# Patient Record
Sex: Female | Born: 1935
Health system: Southern US, Community
[De-identification: ages and names within clinical notes are randomized; demographics above are authoritative.]

## PROBLEM LIST (undated history)

## (undated) DIAGNOSIS — N183 Chronic kidney disease, stage 3 unspecified: Secondary | ICD-10-CM

## (undated) DIAGNOSIS — K649 Unspecified hemorrhoids: Secondary | ICD-10-CM

## (undated) DIAGNOSIS — L659 Nonscarring hair loss, unspecified: Secondary | ICD-10-CM

## (undated) DIAGNOSIS — M1990 Unspecified osteoarthritis, unspecified site: Secondary | ICD-10-CM

## (undated) DIAGNOSIS — F419 Anxiety disorder, unspecified: Secondary | ICD-10-CM

## (undated) DIAGNOSIS — I10 Essential (primary) hypertension: Secondary | ICD-10-CM

## (undated) DIAGNOSIS — M810 Age-related osteoporosis without current pathological fracture: Secondary | ICD-10-CM

## (undated) DIAGNOSIS — F329 Major depressive disorder, single episode, unspecified: Secondary | ICD-10-CM

## (undated) DIAGNOSIS — R51 Headache: Secondary | ICD-10-CM

## (undated) DIAGNOSIS — I129 Hypertensive chronic kidney disease with stage 1 through stage 4 chronic kidney disease, or unspecified chronic kidney disease: Secondary | ICD-10-CM

## (undated) DIAGNOSIS — E782 Mixed hyperlipidemia: Secondary | ICD-10-CM

## (undated) DIAGNOSIS — E039 Hypothyroidism, unspecified: Secondary | ICD-10-CM

## (undated) DIAGNOSIS — G4700 Insomnia, unspecified: Secondary | ICD-10-CM

## (undated) DIAGNOSIS — G8929 Other chronic pain: Secondary | ICD-10-CM

## (undated) DIAGNOSIS — E559 Vitamin D deficiency, unspecified: Secondary | ICD-10-CM

## (undated) DIAGNOSIS — R519 Headache, unspecified: Secondary | ICD-10-CM

## (undated) DIAGNOSIS — F32A Depression, unspecified: Secondary | ICD-10-CM

## (undated) HISTORY — DX: Headache: R51

## (undated) HISTORY — DX: Major depressive disorder, single episode, unspecified: F32.9

## (undated) HISTORY — DX: Hypertensive chronic kidney disease with stage 1 through stage 4 chronic kidney disease, or unspecified chronic kidney disease: I12.9

## (undated) HISTORY — DX: Other chronic pain: G89.29

## (undated) HISTORY — DX: Unspecified osteoarthritis, unspecified site: M19.90

## (undated) HISTORY — DX: Unspecified hemorrhoids: K64.9

## (undated) HISTORY — DX: Vitamin D deficiency, unspecified: E55.9

## (undated) HISTORY — DX: Essential (primary) hypertension: I10

## (undated) HISTORY — DX: Age-related osteoporosis without current pathological fracture: M81.0

## (undated) HISTORY — DX: Hypothyroidism, unspecified: E03.9

## (undated) HISTORY — DX: Insomnia, unspecified: G47.00

## (undated) HISTORY — DX: Mixed hyperlipidemia: E78.2

## (undated) HISTORY — DX: Headache, unspecified: R51.9

## (undated) HISTORY — DX: Chronic kidney disease, stage 3 (moderate): N18.3

## (undated) HISTORY — DX: Anxiety disorder, unspecified: F41.9

## (undated) HISTORY — DX: Chronic kidney disease, stage 3 unspecified: N18.30

## (undated) HISTORY — DX: Depression, unspecified: F32.A

## (undated) HISTORY — DX: Nonscarring hair loss, unspecified: L65.9

---

## 1972-08-09 HISTORY — PX: ABDOMINAL HYSTERECTOMY: SHX81

## 2005-04-27 ENCOUNTER — Ambulatory Visit: Payer: Self-pay | Admitting: Internal Medicine

## 2005-11-18 ENCOUNTER — Ambulatory Visit: Payer: Self-pay | Admitting: Internal Medicine

## 2006-05-04 ENCOUNTER — Ambulatory Visit: Payer: Self-pay | Admitting: Internal Medicine

## 2007-05-10 ENCOUNTER — Ambulatory Visit: Payer: Self-pay | Admitting: Internal Medicine

## 2008-05-22 ENCOUNTER — Ambulatory Visit: Payer: Self-pay | Admitting: Internal Medicine

## 2009-05-28 ENCOUNTER — Ambulatory Visit: Payer: Self-pay | Admitting: Internal Medicine

## 2010-06-10 ENCOUNTER — Ambulatory Visit: Payer: Self-pay | Admitting: Internal Medicine

## 2010-10-07 ENCOUNTER — Ambulatory Visit: Payer: Self-pay | Admitting: Ophthalmology

## 2010-10-19 ENCOUNTER — Ambulatory Visit: Payer: Self-pay | Admitting: Ophthalmology

## 2010-10-19 HISTORY — PX: CATARACT EXTRACTION: SUR2

## 2011-04-02 ENCOUNTER — Other Ambulatory Visit (INDEPENDENT_AMBULATORY_CARE_PROVIDER_SITE_OTHER): Payer: Medicare Other

## 2011-04-02 ENCOUNTER — Encounter: Payer: Self-pay | Admitting: Internal Medicine

## 2011-04-02 ENCOUNTER — Ambulatory Visit (INDEPENDENT_AMBULATORY_CARE_PROVIDER_SITE_OTHER): Payer: Medicare Other | Admitting: Internal Medicine

## 2011-04-02 DIAGNOSIS — K589 Irritable bowel syndrome without diarrhea: Secondary | ICD-10-CM

## 2011-04-02 DIAGNOSIS — R1013 Epigastric pain: Secondary | ICD-10-CM

## 2011-04-02 DIAGNOSIS — Z1211 Encounter for screening for malignant neoplasm of colon: Secondary | ICD-10-CM

## 2011-04-02 DIAGNOSIS — R197 Diarrhea, unspecified: Secondary | ICD-10-CM

## 2011-04-02 LAB — SEDIMENTATION RATE: Sed Rate: 13 mm/hr (ref 0–22)

## 2011-04-02 MED ORDER — ALPRAZOLAM 0.5 MG PO TABS: 0.5000 mg | ORAL_TABLET | Freq: Two times a day (BID) | ORAL | Status: DC | PRN

## 2011-04-02 MED ORDER — PEG-KCL-NACL-NASULF-NA ASC-C 100 G PO SOLR: 1.0000 | Freq: Once | ORAL | Status: DC

## 2011-04-02 NOTE — Patient Instructions (Addendum)
You have been scheduled for an endoscopy and colonoscopy. Please follow the written instructions given to you at your visit today. Please pick up your Moviprep at the pharmacy within the next 2-3 days. You have been scheduled for an abdominal ultrasound at [facility] (1st floor of hospital) on 04/08/11 Thursday at 8:00 am. Please arrive 15 minutes prior to your appointment for registration. Make certain not to have anything to eat or drink 6 hours prior to your appointment. Should you need to reschedule your appointment, please contact radiology at [phone]. Your physician has requested that you go to the basement for the following lab work before leaving today: TSH, TtG, Sedimentation Rate We have given you Dr Dawayne Cirri information. Please contact him to schedule an appointment. We have sent the following medications to your pharmacy for you to pick up at your convenience: Xanax 0.5 mg twice daily CC:Dr Bethann Punches, Dr Caralyn Guile

## 2011-04-02 NOTE — Progress Notes (Signed)
Ana Randall 10-27-1933 MRN [id number]   History of Present Illness:  This is a 77 year old white female who is here to discuss her digestive problems which started after a tragedy occurred in her life. She would not elaborate on the specifics. It happened 1 year ago and she has had abdominal discomfort, urgent diarrhea, dyspepsia and food intolerance since. There is no pre-existing GI history. There is no family history of colon cancer or gallbladder disease. She was told to have an ulcer initially but later on was diagnosed with anxiety and was started on Xanax 0.5 mg at bedtime. It seems to help; she is also on thyroid supplements. She had a colonoscopy in October 2004 which was normal.   Past Medical History  Diagnosis Date  . Senile osteoporosis   . Unspecified hypothyroidism   . Other and unspecified hyperlipidemia   . Essential hypertension, benign   . Anxiety   . Arthritis   . Chronic headaches   . Depression    Past Surgical History  Procedure Date  . Abdominal hysterectomy     reports that she has never smoked. She has never used smokeless tobacco. She reports that she does not drink alcohol or use illicit drugs. family history includes Diabetes in an unspecified family member. Allergies  Allergen Reactions  . Azithromycin   . Sulfa Antibiotics         Review of Systems: Denies dysphagia, odynophagia, chest pain or shortness of breath. Positive for swelling of both feet and for weight gain of 10-15 pounds  The remainder of the 10  point ROS is negative except as outlined in H&P   Physical Exam: General appearance  Well developed, in no distress. Eyes- non icteric. HEENT nontraumatic, normocephalic. Mouth no lesions, tongue papillated, no cheilosis. Neck supple without adenopathy, thyroid not enlarged, no carotid bruits, no JVD. Lungs Clear to auscultation bilaterally. Cor normal S1 normal S2, regular rhythm , no murmur,  quiet precordium. Abdomen soft abdomen  with normoactive bowel sounds minimal epigastric tenderness. Liver edge at costal margin. No CVA tenderness. Lower abdomen normal. Rectal: Stool Hemoccult negative stool. Extremities 1+ pedal edema. Skin no lesions. Neurological alert and oriented x 3. Psychological normal mood and affect.  Assessment and Plan:  Problem #1 Patient has digestive problems characterized by dyspepsia, epigastric pain and urgent diarrhea of one year duration, possibly of functional origin. It started suddenly after a stressful episode. She is due for a colonoscopy for screening. She is also interested in further evaluation to rule out symptomatic gallbladder disease or peptic ulcer disease. We will schedule her for an upper endoscopy and colonoscopy as well as an upper abdominal ultrasound. We will check her TSH level and sedimentation rate as well as tissue transglutaminase. She will increase her Xanax to 0.25 mg in the morning and 0.5 mg at bedtime. I have discussed stress counseling with her and she is agreeable to talking to a psychologist or counselor about dealing with her stress. We gave her a contact number.    04/02/2011 Ana Randall

## 2011-04-05 ENCOUNTER — Telehealth: Payer: Self-pay | Admitting: *Deleted

## 2011-04-05 NOTE — Telephone Encounter (Signed)
error 

## 2011-04-05 NOTE — Telephone Encounter (Signed)
Left a message for patient to call me. 

## 2011-04-05 NOTE — Telephone Encounter (Signed)
Spoke with patient and gave her results. Patient states she is having eye surgery on September 5th and she is also scheduled for a colonoscopy on Sept. 11th. She wants to know if Dr. Juanda Chance thinks this will be okay to have these procedures this close together or if she should postpone the colonoscopy. Please, advise.

## 2011-04-05 NOTE — Telephone Encounter (Signed)
Message copied by Daphine Deutscher on Mon Apr 05, 2011 11:48 AM ------      Message from: Hart Carwin      Created: Sat Apr 03, 2011 11:17 PM       Please cALL pt with all labs normal

## 2011-04-05 NOTE — Telephone Encounter (Signed)
Message copied by Daphine Deutscher on Mon Apr 05, 2011 11:44 AM ------      Message from: Hart Carwin      Created: Sat Apr 03, 2011 11:17 PM       Please cALL pt with all labs normal

## 2011-04-06 ENCOUNTER — Telehealth: Payer: Self-pay | Admitting: *Deleted

## 2011-04-06 NOTE — Telephone Encounter (Signed)
Message copied by Daphine Deutscher on Tue Apr 06, 2011  1:04 PM ------      Message from: Hart Carwin      Created: Tue Apr 06, 2011  9:58 AM       Please call pt with all blood tests negative including sprue test

## 2011-04-06 NOTE — Telephone Encounter (Signed)
Spoke with patient and r/s her ECOL to 05/12/11 11:00Am with 10:00 AM arrival at [facility]. Cancelled her hospital ECOL with Arlys John.

## 2011-04-06 NOTE — Telephone Encounter (Signed)
Patient notified of results as per Dr. Brodie. 

## 2011-04-06 NOTE — Telephone Encounter (Signed)
Message copied by Ana Randall on Tue Apr 06, 2011  1:06 PM ------      Message from: Hart Carwin      Created: Tue Apr 06, 2011  9:58 AM       Please call pt with all blood tests negative including sprue test

## 2011-04-06 NOTE — Telephone Encounter (Signed)
I think she ought  to wait 4 weeks after the eye surgery to have colonoscopyhe

## 2011-04-08 ENCOUNTER — Ambulatory Visit (HOSPITAL_COMMUNITY)
Admission: RE | Admit: 2011-04-08 | Discharge: 2011-04-08 | Disposition: A | Discharge status: Home / Self Care | Payer: Medicare Other | Source: Ambulatory Visit | Attending: Internal Medicine | Admitting: Internal Medicine

## 2011-04-08 DIAGNOSIS — R1013 Epigastric pain: Secondary | ICD-10-CM | POA: Insufficient documentation

## 2011-04-08 DIAGNOSIS — I10 Essential (primary) hypertension: Secondary | ICD-10-CM | POA: Insufficient documentation

## 2011-04-08 DIAGNOSIS — K8020 Calculus of gallbladder without cholecystitis without obstruction: Secondary | ICD-10-CM | POA: Insufficient documentation

## 2011-04-08 DIAGNOSIS — K7689 Other specified diseases of liver: Secondary | ICD-10-CM | POA: Insufficient documentation

## 2011-04-13 ENCOUNTER — Telehealth: Payer: Self-pay | Admitting: *Deleted

## 2011-04-13 NOTE — Telephone Encounter (Signed)
Message copied by Daphine Deutscher on Tue Apr 13, 2011  9:12 AM ------      Message from: Hart Carwin      Created: Sun Apr 11, 2011  6:39 PM       Please call pt with a single gall bladder stone found on the ultrasound. No signs of acute or chronic inflammation. Please keep Your appointment for EGD/colon.

## 2011-04-13 NOTE — Telephone Encounter (Signed)
Patient given results as per Dr. Brodie 

## 2011-04-13 NOTE — Telephone Encounter (Signed)
Left a message for patient to call me. 

## 2011-04-14 ENCOUNTER — Ambulatory Visit: Payer: Self-pay | Admitting: Ophthalmology

## 2011-04-14 HISTORY — PX: CATARACT EXTRACTION: SUR2

## 2011-04-20 ENCOUNTER — Encounter: Payer: Medicare Other | Admitting: Internal Medicine

## 2011-04-23 ENCOUNTER — Ambulatory Visit (INDEPENDENT_AMBULATORY_CARE_PROVIDER_SITE_OTHER): Payer: No Typology Code available for payment source | Admitting: Licensed Clinical Social Worker

## 2011-04-23 DIAGNOSIS — F331 Major depressive disorder, recurrent, moderate: Secondary | ICD-10-CM

## 2011-04-23 DIAGNOSIS — F411 Generalized anxiety disorder: Secondary | ICD-10-CM

## 2011-04-30 ENCOUNTER — Ambulatory Visit: Payer: Medicare Other | Admitting: Psychology

## 2011-05-03 ENCOUNTER — Ambulatory Visit (INDEPENDENT_AMBULATORY_CARE_PROVIDER_SITE_OTHER): Payer: No Typology Code available for payment source | Admitting: Licensed Clinical Social Worker

## 2011-05-03 DIAGNOSIS — F331 Major depressive disorder, recurrent, moderate: Secondary | ICD-10-CM

## 2011-05-03 DIAGNOSIS — F411 Generalized anxiety disorder: Secondary | ICD-10-CM

## 2011-05-05 ENCOUNTER — Ambulatory Visit: Payer: Self-pay | Admitting: Internal Medicine

## 2011-05-06 ENCOUNTER — Telehealth: Payer: Self-pay | Admitting: *Deleted

## 2011-05-06 NOTE — Telephone Encounter (Signed)
Spoke to patient and went over cancellation agreement, advanced directives and financial responsibility form for [facility] since she was originally scheduled at the hospital and did not receive the [facility] paperwork at that time. Patient verbalizes understanding and states that she will bring back signed copies of the forms with her to her appointment (info has been mailed to her).

## 2011-05-10 ENCOUNTER — Ambulatory Visit: Payer: Medicare Other | Admitting: Psychology

## 2011-05-12 ENCOUNTER — Encounter: Payer: Self-pay | Admitting: Internal Medicine

## 2011-05-12 ENCOUNTER — Ambulatory Visit (AMBULATORY_SURGERY_CENTER): Payer: Medicare Other | Admitting: Internal Medicine

## 2011-05-12 DIAGNOSIS — R109 Unspecified abdominal pain: Secondary | ICD-10-CM

## 2011-05-12 DIAGNOSIS — Z1211 Encounter for screening for malignant neoplasm of colon: Secondary | ICD-10-CM

## 2011-05-12 DIAGNOSIS — R1013 Epigastric pain: Secondary | ICD-10-CM

## 2011-05-12 MED ORDER — DICYCLOMINE HCL 10 MG PO CAPS: 10.0000 mg | ORAL_CAPSULE | Freq: Two times a day (BID) | ORAL | Status: DC | PRN

## 2011-05-12 MED ORDER — SODIUM CHLORIDE 0.9 % IV SOLN: 500.0000 mL | INTRAVENOUS | Status: DC

## 2011-05-12 NOTE — Patient Instructions (Signed)
Green and blue discharge instructions reviewed with patient and care partner.  Impressions/recommendations:  Normal EGD  Normal colonoscopy  Await biopsy results for causes of diarrhea.  Continue medications as you were taking them prior to your exam.

## 2011-05-13 ENCOUNTER — Telehealth: Payer: Self-pay | Admitting: *Deleted

## 2011-05-13 NOTE — Telephone Encounter (Signed)

## 2011-05-17 ENCOUNTER — Encounter: Payer: Self-pay | Admitting: Internal Medicine

## 2011-05-17 ENCOUNTER — Ambulatory Visit: Payer: No Typology Code available for payment source | Admitting: Licensed Clinical Social Worker

## 2011-06-30 ENCOUNTER — Ambulatory Visit: Payer: Self-pay | Admitting: Internal Medicine

## 2011-09-20 ENCOUNTER — Emergency Department (HOSPITAL_COMMUNITY)
Admission: EM | Admit: 2011-09-20 | Discharge: 2011-09-20 | Disposition: A | Discharge status: Home / Self Care | Payer: Medicare Other | Attending: Emergency Medicine | Admitting: Emergency Medicine

## 2011-09-20 ENCOUNTER — Encounter (HOSPITAL_COMMUNITY): Payer: Self-pay | Admitting: Emergency Medicine

## 2011-09-20 DIAGNOSIS — E039 Hypothyroidism, unspecified: Secondary | ICD-10-CM | POA: Insufficient documentation

## 2011-09-20 DIAGNOSIS — F411 Generalized anxiety disorder: Secondary | ICD-10-CM | POA: Insufficient documentation

## 2011-09-20 DIAGNOSIS — R4589 Other symptoms and signs involving emotional state: Secondary | ICD-10-CM

## 2011-09-20 DIAGNOSIS — F419 Anxiety disorder, unspecified: Secondary | ICD-10-CM

## 2011-09-20 DIAGNOSIS — F4389 Other reactions to severe stress: Secondary | ICD-10-CM | POA: Insufficient documentation

## 2011-09-20 DIAGNOSIS — I10 Essential (primary) hypertension: Secondary | ICD-10-CM | POA: Insufficient documentation

## 2011-09-20 DIAGNOSIS — F438 Other reactions to severe stress: Secondary | ICD-10-CM | POA: Insufficient documentation

## 2011-09-20 DIAGNOSIS — Z79899 Other long term (current) drug therapy: Secondary | ICD-10-CM | POA: Insufficient documentation

## 2011-09-20 DIAGNOSIS — M129 Arthropathy, unspecified: Secondary | ICD-10-CM | POA: Insufficient documentation

## 2011-09-20 DIAGNOSIS — M818 Other osteoporosis without current pathological fracture: Secondary | ICD-10-CM | POA: Insufficient documentation

## 2011-09-20 NOTE — ED Provider Notes (Signed)
History     CSN: [id number]  Arrival date & time 09/20/11  1002   First MD Initiated Contact with Patient 09/20/11 1112      Chief Complaint  Patient presents with  . Panic Attack    (Consider location/radiation/quality/duration/timing/severity/associated sxs/prior treatment) The history is provided by the patient.   the patient is a 78 year old, female, who presents to the emergency department complaining of anxiety and stress.  She states that her husband and she went home to another person, who lives next door to them.  The attendant calls the house, frequently, and interrupts her time with her husband.  This occurs while their home, as well as when they are out to dinner.  Is causing stress between her and her husband.  She denies suicidal or homicidal ideations.  She says the last incident was 2 nights ago, when she was at dinner, at a restaurant with her husband.  She began thinking about it again.  Today, and it caused her to have an anxiety attack.  She is asymptomatic now.  She has no other complaints.  Past Medical History  Diagnosis Date  . Senile osteoporosis   . Unspecified hypothyroidism   . Essential hypertension, benign   . Anxiety   . Arthritis   . Chronic headaches   . Depression     Past Surgical History  Procedure Date  . Abdominal hysterectomy   . Cataract extraction     Family History  Problem Relation Age of Onset  . Diabetes      History  Substance Use Topics  . Smoking status: Never Smoker   . Smokeless tobacco: Never Used  . Alcohol Use: No    OB History    Grav Para Term Preterm Abortions TAB SAB Ect Mult Living                  Review of Systems  Psychiatric/Behavioral:       Anxiety    Allergies  Azithromycin and Sulfa antibiotics  Home Medications   Current Outpatient Rx  Name Route Sig Dispense Refill  . ACETAMINOPHEN 500 MG PO TABS Oral Take 1,000 mg by mouth every 6 (six) hours as needed. For pain    . ALPRAZOLAM 0.5  MG PO TABS Oral Take 0.5 mg by mouth 2 (two) times daily as needed. For anxiety    . SUPER B COMPLEX PO TABS Oral Take by mouth 1 day or 1 dose.      Marland Kitchen CALCIUM PO Oral Take 1 capsule by mouth 2 (two) times daily.     . DHA-EPA-COENZYME Q10-VITAMIN E 120-180-50-30 PO CAPS Oral Take 1 capsule by mouth daily.     Marland Kitchen DICYCLOMINE HCL 10 MG PO CAPS Oral Take 10 mg by mouth 2 (two) times daily as needed. For IBS    . OMEGA-3 FATTY ACIDS 1000 MG PO CAPS Oral Take 1 g by mouth daily.     Marland Kitchen LEVOTHYROXINE SODIUM 25 MCG PO TABS Oral Take 25 mcg by mouth daily.      Marland Kitchen LISINOPRIL-HYDROCHLOROTHIAZIDE 20-12.5 MG PO TABS Oral Take 1 tablet by mouth daily.      Marygrace Drought WOMENS PO Oral Take by mouth. Once a day       BP 126/54  Pulse 80  Temp(Src) 98.3 F (36.8 C) (Oral)  Resp 16  SpO2 96%  Physical Exam  Constitutional: She is oriented to person, place, and time. She appears well-developed and well-nourished.  HENT:  Head: Normocephalic and  atraumatic.  Eyes: Conjunctivae are normal.  Neck: Normal range of motion.  Pulmonary/Chest: Effort normal.  Musculoskeletal: Normal range of motion.  Neurological: She is alert and oriented to person, place, and time.  Skin: Skin is warm and dry.  Psychiatric: She has a normal mood and affect. Her behavior is normal. Judgment and thought content normal.    ED Course  Procedures (including critical care time)  Labs Reviewed - No data to display No results found.   1. Anxiety   2. Difficulty coping       MDM  He is ED with coping, difficulty.  No suicidal or homicidal ideations.  No delusions.  No psychosis.  There is no indication for testing or intervention in the emergency department today.        Nicholes Stairs, MD 09/20/11 1128

## 2011-09-20 NOTE — ED Notes (Signed)
Panic attack x 2-3 months   Coming from one of her renters she states

## 2011-09-20 NOTE — ED Notes (Signed)
Pt's husband is in the waiting room.  She does not want him in the room during her assessment, but she would like the doctor to speak with her husband separately.

## 2011-09-20 NOTE — ED Notes (Signed)
Called [place], [facility] and made aware of pt's suicide assessment.  Pt is being placed in paper scrubs and well transfer to [place].

## 2011-09-20 NOTE — ED Notes (Signed)
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  Pt states that when she has to be around the tenant or even speak to the tenant on the phone she begins to shake and is unable to stop the shaking.  Pt also reports that she becomes SOB during these times.

## 2011-10-28 ENCOUNTER — Ambulatory Visit: Payer: Self-pay | Admitting: Internal Medicine

## 2012-05-26 ENCOUNTER — Emergency Department (HOSPITAL_COMMUNITY)
Admission: EM | Admit: 2012-05-26 | Discharge: 2012-05-26 | Disposition: A | Discharge status: Home / Self Care | Payer: Medicare Other | Attending: Emergency Medicine | Admitting: Emergency Medicine

## 2012-05-26 ENCOUNTER — Encounter (HOSPITAL_COMMUNITY): Payer: Self-pay | Admitting: *Deleted

## 2012-05-26 ENCOUNTER — Emergency Department (HOSPITAL_COMMUNITY): Payer: Medicare Other

## 2012-05-26 DIAGNOSIS — E039 Hypothyroidism, unspecified: Secondary | ICD-10-CM | POA: Insufficient documentation

## 2012-05-26 DIAGNOSIS — Z79899 Other long term (current) drug therapy: Secondary | ICD-10-CM | POA: Insufficient documentation

## 2012-05-26 DIAGNOSIS — R51 Headache: Secondary | ICD-10-CM | POA: Insufficient documentation

## 2012-05-26 DIAGNOSIS — I10 Essential (primary) hypertension: Secondary | ICD-10-CM | POA: Insufficient documentation

## 2012-05-26 LAB — BASIC METABOLIC PANEL
CO2: 25 mEq/L (ref 19–32)
Calcium: 10.6 mg/dL — ABNORMAL HIGH (ref 8.4–10.5)
Chloride: 102 mEq/L (ref 96–112)
Creatinine, Ser: 1.06 mg/dL (ref 0.50–1.10)
Glucose, Bld: 112 mg/dL — ABNORMAL HIGH (ref 70–99)

## 2012-05-26 LAB — CBC WITH DIFFERENTIAL/PLATELET
Eosinophils Relative: 6 % — ABNORMAL HIGH (ref 0–5)
HCT: 37 % (ref 36.0–46.0)
Hemoglobin: 12.6 g/dL (ref 12.0–15.0)
Lymphocytes Relative: 22 % (ref 12–46)
Lymphs Abs: 1.8 10*3/uL (ref 0.7–4.0)
MCV: 89.6 fL (ref 78.0–100.0)
Monocytes Absolute: 0.5 10*3/uL (ref 0.1–1.0)
Neutro Abs: 5.4 10*3/uL (ref 1.7–7.7)
RBC: 4.13 MIL/uL (ref 3.87–5.11)
WBC: 8.3 10*3/uL (ref 4.0–10.5)

## 2012-05-26 MED ORDER — SODIUM CHLORIDE 0.9 % IV BOLUS (SEPSIS)
1000.0000 mL | Freq: Once | INTRAVENOUS | Status: AC
  Administered 2012-05-26: 1000 mL via INTRAVENOUS

## 2012-05-26 MED ORDER — METOCLOPRAMIDE HCL 5 MG/ML IJ SOLN
10.0000 mg | Freq: Once | INTRAMUSCULAR | Status: AC
  Administered 2012-05-26: 10 mg via INTRAVENOUS
  Filled 2012-05-26: qty 2

## 2012-05-26 MED ORDER — IBUPROFEN 600 MG PO TABS: 600.0000 mg | ORAL_TABLET | Freq: Four times a day (QID) | ORAL | Status: DC | PRN

## 2012-05-26 MED ORDER — KETOROLAC TROMETHAMINE 30 MG/ML IJ SOLN
30.0000 mg | Freq: Once | INTRAMUSCULAR | Status: AC
  Administered 2012-05-26: 30 mg via INTRAVENOUS
  Filled 2012-05-26: qty 1

## 2012-05-26 NOTE — ED Notes (Signed)
Rt. Side, occipital region of head hurts; dr. Charline Bills, "related to high bp." Then, Wed. Radiated to lt. Side of head, occipital region, now sharp pains in head, and nose feels stopped up; cough and swallowing makes pain worse

## 2012-05-26 NOTE — ED Provider Notes (Addendum)
History     CSN: [id number]  Arrival date & time 05/26/12  1478   First MD Initiated Contact with Patient 05/26/12 [phone]      Chief Complaint  Patient presents with  . Headache    (Consider location/radiation/quality/duration/timing/severity/associated sxs/prior treatment) HPI Comments: 78 y/o woman comes in with cc of headaches. Pt has hx of diabetes. Pt started having headaches on Tuesday. The headache was on the right side first, then on left side, and now on the back. The pain is described as pressure like pain. The pain only lasts for seconds, and is usually precipitated by cough, moment. There is no nausea, vomiting, visual complains, seizures, altered mental status, loss of consciousness, new weakness, or numbness, no gait instability. Pt has no neck stiffness, pain. No hx of similar complain before.   Patient is a 78 y.o. female presenting with headaches. The history is provided by the patient.  Headache  Pertinent negatives include no shortness of breath, no nausea and no vomiting.    Past Medical History  Diagnosis Date  . Senile osteoporosis   . Unspecified hypothyroidism   . Essential hypertension, benign   . Anxiety   . Arthritis   . Chronic headaches   . Depression     Past Surgical History  Procedure Date  . Abdominal hysterectomy   . Cataract extraction     Family History  Problem Relation Age of Onset  . Diabetes      History  Substance Use Topics  . Smoking status: Never Smoker   . Smokeless tobacco: Never Used  . Alcohol Use: No    OB History    Grav Para Term Preterm Abortions TAB SAB Ect Mult Living                  Review of Systems  Constitutional: Negative for activity change.  HENT: Negative for facial swelling and neck pain.   Respiratory: Negative for cough, shortness of breath and wheezing.   Cardiovascular: Negative for chest pain.  Gastrointestinal: Negative for nausea, vomiting, abdominal pain, diarrhea, constipation, blood  in stool and abdominal distention.  Genitourinary: Negative for hematuria and difficulty urinating.  Skin: Negative for color change.  Neurological: Positive for headaches. Negative for speech difficulty.  Hematological: Does not bruise/bleed easily.  Psychiatric/Behavioral: Negative for confusion.    Allergies  Azithromycin  Home Medications   Current Outpatient Rx  Name Route Sig Dispense Refill  . ALPRAZOLAM 0.5 MG PO TABS Oral Take 0.5 mg by mouth at bedtime.     Marland Kitchen LEVOTHYROXINE SODIUM 25 MCG PO TABS Oral Take 25 mcg by mouth daily.      . ADULT MULTIVITAMIN W/MINERALS CH Oral Take 1 tablet by mouth daily.    Marland Kitchen OLMESARTAN MEDOXOMIL-HCTZ 40-12.5 MG PO TABS Oral Take 1 tablet by mouth daily.    Marland Kitchen OVER THE COUNTER MEDICATION Oral Take 1 tablet by mouth daily. Calcium 600 + D    . VITAMIN E 400 UNITS PO CAPS Oral Take 400 Units by mouth daily.      BP 167/74  Pulse 79  Temp 98.5 F (36.9 C) (Oral)  Resp 18  SpO2 94%  Physical Exam  Constitutional: She is oriented to person, place, and time. She appears well-developed.  HENT:  Head: Normocephalic and atraumatic.       No bruits on neck exam  Eyes: Conjunctivae normal and EOM are normal. Pupils are equal, round, and reactive to light.  Neck: Normal range of motion. Neck  supple.  Cardiovascular: Normal rate, regular rhythm and normal heart sounds.   Pulmonary/Chest: Effort normal and breath sounds normal. No respiratory distress.  Abdominal: Soft. Bowel sounds are normal. She exhibits no distension. There is no tenderness. There is no rebound and no guarding.  Neurological: She is alert and oriented to person, place, and time. She displays normal reflexes. No cranial nerve deficit. Coordination normal.       Cerebellar exam, motor and sensory exam are all WNL  Skin: Skin is warm and dry.    ED Course  Procedures (including critical care time)  Labs Reviewed  CBC WITH DIFFERENTIAL - Abnormal; Notable for the following:      Eosinophils Relative 6 (*)     All other components within normal limits  BASIC METABOLIC PANEL   No results found.   No diagnosis found.    MDM  DDX includes: Primary headaches - including migrainous headaches, cluster headaches, tension headaches. ICH Carotid dissection Cavernous sinus thrombosis Meningitis Encephalitis Sinusitis Tumor Vascular headaches AV malformation Brain aneurysm Muscular headaches  A/P: Pt comes in with cc of headaches. Pt's headache is intermittent, not a thunder clap headache, and it started on Tuesday, with no neuro complains, no meningeal signs. Concerns for life threatning secondary headache is low. We will get CT head, and basic labs with hydration and pain meds, and then reassess.    Derwood Kaplan, MD 05/26/12 1037  Headache completely resolved, not even reproduced with movement like before. Will d.c.  Derwood Kaplan, MD 05/26/12 1310

## 2012-07-26 ENCOUNTER — Ambulatory Visit: Payer: Self-pay | Admitting: Internal Medicine

## 2013-04-22 ENCOUNTER — Emergency Department (HOSPITAL_COMMUNITY): Payer: Medicare Other

## 2013-04-22 ENCOUNTER — Emergency Department (HOSPITAL_COMMUNITY)
Admission: EM | Admit: 2013-04-22 | Discharge: 2013-04-22 | Disposition: A | Discharge status: Home / Self Care | Payer: Medicare Other | Attending: Emergency Medicine | Admitting: Emergency Medicine

## 2013-04-22 ENCOUNTER — Encounter (HOSPITAL_COMMUNITY): Payer: Self-pay | Admitting: Nurse Practitioner

## 2013-04-22 DIAGNOSIS — R002 Palpitations: Secondary | ICD-10-CM | POA: Insufficient documentation

## 2013-04-22 DIAGNOSIS — M810 Age-related osteoporosis without current pathological fracture: Secondary | ICD-10-CM | POA: Insufficient documentation

## 2013-04-22 DIAGNOSIS — F411 Generalized anxiety disorder: Secondary | ICD-10-CM | POA: Insufficient documentation

## 2013-04-22 DIAGNOSIS — E039 Hypothyroidism, unspecified: Secondary | ICD-10-CM | POA: Insufficient documentation

## 2013-04-22 DIAGNOSIS — R42 Dizziness and giddiness: Secondary | ICD-10-CM | POA: Insufficient documentation

## 2013-04-22 DIAGNOSIS — Z79899 Other long term (current) drug therapy: Secondary | ICD-10-CM | POA: Insufficient documentation

## 2013-04-22 DIAGNOSIS — I10 Essential (primary) hypertension: Secondary | ICD-10-CM | POA: Insufficient documentation

## 2013-04-22 DIAGNOSIS — M129 Arthropathy, unspecified: Secondary | ICD-10-CM | POA: Insufficient documentation

## 2013-04-22 DIAGNOSIS — F329 Major depressive disorder, single episode, unspecified: Secondary | ICD-10-CM | POA: Insufficient documentation

## 2013-04-22 DIAGNOSIS — F3289 Other specified depressive episodes: Secondary | ICD-10-CM | POA: Insufficient documentation

## 2013-04-22 LAB — BASIC METABOLIC PANEL
BUN: 16 mg/dL (ref 6–23)
CO2: 26 mEq/L (ref 19–32)
Calcium: 10.7 mg/dL — ABNORMAL HIGH (ref 8.4–10.5)
Chloride: 98 mEq/L (ref 96–112)
Creatinine, Ser: 1.02 mg/dL (ref 0.50–1.10)

## 2013-04-22 LAB — CBC
HCT: 38.7 % (ref 36.0–46.0)
MCH: 30.2 pg (ref 26.0–34.0)
MCV: 87.4 fL (ref 78.0–100.0)
Platelets: 284 10*3/uL (ref 150–400)
RBC: 4.43 MIL/uL (ref 3.87–5.11)
RDW: 13.7 % (ref 11.5–15.5)
WBC: 8 10*3/uL (ref 4.0–10.5)

## 2013-04-22 LAB — POCT I-STAT TROPONIN I: Troponin i, poc: 0 ng/mL (ref 0.00–0.08)

## 2013-04-22 NOTE — ED Provider Notes (Signed)
CSN: [id number]     Arrival date & time 04/22/13  1050 History   First MD Initiated Contact with Patient 04/22/13 1105     Chief Complaint  Patient presents with  . Tachycardia   (Consider location/radiation/quality/duration/timing/severity/associated sxs/prior Treatment) HPI Patient presents with concern of palpitations. She has had similar episodes in the past, and several episodes of the past week. However, today, soon after awakening she felt suddenly lightheaded, felt her pulse to be greater than 100. There is no associated chest pain, belly pain, nausea, vomiting, syncope. Symptoms persisted, and she presents here for evaluation.  On my exam she states that she feels moderately better. No clear precipitant, and no clear alleviating or exacerbating factors. She states that she was in her usual state of health prior to the onset of symptoms.  Past Medical History  Diagnosis Date  . Senile osteoporosis   . Unspecified hypothyroidism   . Essential hypertension, benign   . Anxiety   . Arthritis   . Chronic headaches   . Depression    Past Surgical History  Procedure Laterality Date  . Abdominal hysterectomy    . Cataract extraction     Family History  Problem Relation Age of Onset  . Diabetes     History  Substance Use Topics  . Smoking status: Never Smoker   . Smokeless tobacco: Never Used  . Alcohol Use: No   OB History   Grav Para Term Preterm Abortions TAB SAB Ect Mult Living                 Review of Systems  Constitutional:       Per HPI, otherwise negative  HENT:       Per HPI, otherwise negative  Respiratory:       Per HPI, otherwise negative  Cardiovascular:       Per HPI, otherwise negative  Gastrointestinal: Negative for vomiting.  Endocrine:       Negative aside from HPI  Genitourinary:       Neg aside from HPI   Musculoskeletal:       Per HPI, otherwise negative  Skin: Negative.   Neurological: Positive for light-headedness. Negative for  syncope.    Allergies  Azithromycin  Home Medications   Current Outpatient Rx  Name  Route  Sig  Dispense  Refill  . ALPRAZolam (XANAX) 0.5 MG tablet   Oral   Take 0.5 mg by mouth at bedtime.          Marland Kitchen ibuprofen (ADVIL,MOTRIN) 600 MG tablet   Oral   Take 1 tablet (600 mg total) by mouth every 6 (six) hours as needed for pain.   30 tablet   0   . levothyroxine (SYNTHROID, LEVOTHROID) 25 MCG tablet   Oral   Take 25 mcg by mouth daily.           . Multiple Vitamin (MULTIVITAMIN WITH MINERALS) TABS   Oral   Take 1 tablet by mouth daily.         Marland Kitchen olmesartan-hydrochlorothiazide (BENICAR HCT) 40-12.5 MG per tablet   Oral   Take 1 tablet by mouth daily.         Marland Kitchen OVER THE COUNTER MEDICATION   Oral   Take 1 tablet by mouth daily. Calcium 600 + D         . vitamin E 400 UNIT capsule   Oral   Take 400 Units by mouth daily.  BP 130/58  Pulse 92  Temp(Src) 98.2 F (36.8 C) (Oral)  Resp 17  Ht 5' (1.524 m)  Wt 128 lb (58.06 kg)  BMI 25 kg/m2  SpO2 95% Physical Exam  Nursing note and vitals reviewed. Constitutional: She is oriented to person, place, and time. She appears well-developed and well-nourished. No distress.  HENT:  Head: Normocephalic and atraumatic.  Eyes: Conjunctivae and EOM are normal.  Cardiovascular: Regular rhythm.  Tachycardia present.   Pulmonary/Chest: Effort normal and breath sounds normal. No stridor. No respiratory distress.  Abdominal: She exhibits no distension.  Musculoskeletal: She exhibits no edema.  Neurological: She is alert and oriented to person, place, and time. No cranial nerve deficit.  Skin: Skin is warm and dry.  Psychiatric: She has a normal mood and affect.    ED Course  Procedures (including critical care time) Labs Review Labs Reviewed  BASIC METABOLIC PANEL - Abnormal; Notable for the following:    Glucose, Bld 108 (*)    Calcium 10.7 (*)    GFR calc non Af Amer 52 (*)    GFR calc Af Amer 60 (*)     All other components within normal limits  CBC  POCT I-STAT TROPONIN I   Imaging Review Dg Chest 2 View  04/22/2013   *RADIOLOGY REPORT*  Clinical Data: Tachycardia  CHEST - 2 VIEW  Comparison: None.  Findings: The heart size and vascular pattern are normal.  The lungs are clear.  IMPRESSION: Negative   Original Report Authenticated By: Esperanza Heir, M.D.   Pulse oximetry 99% room air normal Cardiac monitor is regular, 110, sinus, abnormal EKG has sinus tachycardia with a rate of 110, rightward axis, no notable changes from prior.  1:57 PM HR in 80's, no symptoms. I discussed all findings with the patient and her husband.  Absent any ongoing symptoms, with reassuring labs, she'll follow up with her physician tomorrow. MDM  No diagnosis found. This patient with a history of anxiety, palpitations now presents with palpitations and on initial exam does have tachycardia.  However, improved substantially here, with minimal intervention beyond fluids.  With the resolution of symptoms, the absence of distress, and with a previously diagnosed anxiety and palpitations, patient is appropriate for discharge with outpatient management.  Absent pain, ischemia on EKG, lightheadedness, syncope, persistent symptoms, there is no indication for admission, though additional evaluation and management is required.    Gerhard Munch, MD 04/22/13 1359

## 2013-04-22 NOTE — ED Notes (Signed)
States she was getting ready for church and suddenly felt sweaty, lightheaded, and shaky. She checked her heart rate and it was 120. Pt reports history of anxiety and this "feels similar." denies any pain

## 2013-04-22 NOTE — ED Notes (Signed)
MD at bedside. 

## 2013-07-30 ENCOUNTER — Ambulatory Visit: Payer: Self-pay | Admitting: Internal Medicine

## 2014-08-06 ENCOUNTER — Ambulatory Visit: Payer: Self-pay | Admitting: Family Medicine

## 2014-11-25 DIAGNOSIS — Z87898 Personal history of other specified conditions: Secondary | ICD-10-CM | POA: Insufficient documentation

## 2014-12-18 DIAGNOSIS — G4700 Insomnia, unspecified: Secondary | ICD-10-CM | POA: Diagnosis not present

## 2014-12-18 DIAGNOSIS — Z634 Disappearance and death of family member: Secondary | ICD-10-CM | POA: Diagnosis not present

## 2014-12-18 DIAGNOSIS — E039 Hypothyroidism, unspecified: Secondary | ICD-10-CM | POA: Diagnosis not present

## 2014-12-18 DIAGNOSIS — E782 Mixed hyperlipidemia: Secondary | ICD-10-CM | POA: Diagnosis not present

## 2014-12-18 DIAGNOSIS — I10 Essential (primary) hypertension: Secondary | ICD-10-CM | POA: Diagnosis not present

## 2015-03-03 DIAGNOSIS — H524 Presbyopia: Secondary | ICD-10-CM | POA: Diagnosis not present

## 2015-03-11 ENCOUNTER — Ambulatory Visit: Payer: Self-pay | Admitting: Family Medicine

## 2015-03-13 ENCOUNTER — Encounter: Payer: Self-pay | Admitting: Family Medicine

## 2015-03-13 ENCOUNTER — Ambulatory Visit (INDEPENDENT_AMBULATORY_CARE_PROVIDER_SITE_OTHER): Payer: Medicare Other | Admitting: Family Medicine

## 2015-03-13 VITALS — BP 132/78 | HR 80 | Temp 98.0°F | Resp 16 | Ht 63.0 in | Wt 137.1 lb

## 2015-03-13 DIAGNOSIS — Z85828 Personal history of other malignant neoplasm of skin: Secondary | ICD-10-CM | POA: Insufficient documentation

## 2015-03-13 DIAGNOSIS — E782 Mixed hyperlipidemia: Secondary | ICD-10-CM | POA: Diagnosis not present

## 2015-03-13 DIAGNOSIS — E559 Vitamin D deficiency, unspecified: Secondary | ICD-10-CM | POA: Insufficient documentation

## 2015-03-13 DIAGNOSIS — N951 Menopausal and female climacteric states: Secondary | ICD-10-CM | POA: Insufficient documentation

## 2015-03-13 DIAGNOSIS — L282 Other prurigo: Secondary | ICD-10-CM

## 2015-03-13 DIAGNOSIS — E039 Hypothyroidism, unspecified: Secondary | ICD-10-CM | POA: Insufficient documentation

## 2015-03-13 DIAGNOSIS — Z8669 Personal history of other diseases of the nervous system and sense organs: Secondary | ICD-10-CM | POA: Insufficient documentation

## 2015-03-13 DIAGNOSIS — I130 Hypertensive heart and chronic kidney disease with heart failure and stage 1 through stage 4 chronic kidney disease, or unspecified chronic kidney disease: Secondary | ICD-10-CM | POA: Insufficient documentation

## 2015-03-13 DIAGNOSIS — G4700 Insomnia, unspecified: Secondary | ICD-10-CM | POA: Insufficient documentation

## 2015-03-13 DIAGNOSIS — K649 Unspecified hemorrhoids: Secondary | ICD-10-CM | POA: Insufficient documentation

## 2015-03-13 DIAGNOSIS — M810 Age-related osteoporosis without current pathological fracture: Secondary | ICD-10-CM | POA: Insufficient documentation

## 2015-03-13 DIAGNOSIS — F419 Anxiety disorder, unspecified: Secondary | ICD-10-CM | POA: Insufficient documentation

## 2015-03-13 DIAGNOSIS — L659 Nonscarring hair loss, unspecified: Secondary | ICD-10-CM | POA: Insufficient documentation

## 2015-03-13 DIAGNOSIS — I10 Essential (primary) hypertension: Secondary | ICD-10-CM | POA: Insufficient documentation

## 2015-03-13 MED ORDER — HYDROCORTISONE 2.5 % EX CREA: TOPICAL_CREAM | Freq: Two times a day (BID) | CUTANEOUS | Status: DC

## 2015-03-13 NOTE — Patient Instructions (Signed)
Fat and Cholesterol Control Diet Fat and cholesterol levels in your blood and organs are influenced by your diet. High levels of fat and cholesterol may lead to diseases of the heart, small and large blood vessels, gallbladder, liver, and pancreas. CONTROLLING FAT AND CHOLESTEROL WITH DIET Although exercise and lifestyle factors are important, your diet is key. That is because certain foods are known to raise cholesterol and others to lower it. The goal is to balance foods for their effect on cholesterol and more importantly, to replace saturated and trans fat with other types of fat, such as monounsaturated fat, polyunsaturated fat, and omega-3 fatty acids. On average, a person should consume no more than 15 to 17 g of saturated fat daily. Saturated and trans fats are considered "bad" fats, and they will raise LDL cholesterol. Saturated fats are primarily found in animal products such as meats, butter, and cream. However, that does not mean you need to give up all your favorite foods. Today, there are good tasting, low-fat, low-cholesterol substitutes for most of the things you like to eat. Choose low-fat or nonfat alternatives. Choose round or loin cuts of red meat. These types of cuts are lowest in fat and cholesterol. Chicken (without the skin), fish, veal, and ground turkey breast are great choices. Eliminate fatty meats, such as hot dogs and salami. Even shellfish have little or no saturated fat. Have a 3 oz (85 g) portion when you eat lean meat, poultry, or fish. Trans fats are also called "partially hydrogenated oils." They are oils that have been scientifically manipulated so that they are solid at room temperature resulting in a longer shelf life and improved taste and texture of foods in which they are added. Trans fats are found in stick margarine, some tub margarines, cookies, crackers, and baked goods.  When baking and cooking, oils are a great substitute for butter. The monounsaturated oils are  especially beneficial since it is believed they lower LDL and raise HDL. The oils you should avoid entirely are saturated tropical oils, such as coconut and palm.  Remember to eat a lot from food groups that are naturally free of saturated and trans fat, including fish, fruit, vegetables, beans, grains (barley, rice, couscous, bulgur wheat), and pasta (without cream sauces).  IDENTIFYING FOODS THAT LOWER FAT AND CHOLESTEROL  Soluble fiber may lower your cholesterol. This type of fiber is found in fruits such as apples, vegetables such as broccoli, potatoes, and carrots, legumes such as beans, peas, and lentils, and grains such as barley. Foods fortified with plant sterols (phytosterol) may also lower cholesterol. You should eat at least 2 g per day of these foods for a cholesterol lowering effect.  Read package labels to identify low-saturated fats, trans fat free, and low-fat foods at the supermarket. Select cheeses that have only 2 to 3 g saturated fat per ounce. Use a heart-healthy tub margarine that is free of trans fats or partially hydrogenated oil. When buying baked goods (cookies, crackers), avoid partially hydrogenated oils. Breads and muffins should be made from whole grains (whole-wheat or whole oat flour, instead of "flour" or "enriched flour"). Buy non-creamy canned soups with reduced salt and no added fats.  FOOD PREPARATION TECHNIQUES  Never deep-fry. If you must fry, either stir-fry, which uses very little fat, or use non-stick cooking sprays. When possible, broil, bake, or roast meats, and steam vegetables. Instead of putting butter or margarine on vegetables, use lemon and herbs, applesauce, and cinnamon (for squash and sweet potatoes). Use nonfat   yogurt, salsa, and low-fat dressings for salads.  LOW-SATURATED FAT / LOW-FAT FOOD SUBSTITUTES Meats / Saturated Fat (g)  Avoid: Steak, marbled (3 oz/85 g) / 11 g  Choose: Steak, lean (3 oz/85 g) / 4 g  Avoid: Hamburger (3 oz/85 g) / 7  g  Choose: Hamburger, lean (3 oz/85 g) / 5 g  Avoid: Ham (3 oz/85 g) / 6 g  Choose: Ham, lean cut (3 oz/85 g) / 2.4 g  Avoid: Chicken, with skin, dark meat (3 oz/85 g) / 4 g  Choose: Chicken, skin removed, dark meat (3 oz/85 g) / 2 g  Avoid: Chicken, with skin, light meat (3 oz/85 g) / 2.5 g  Choose: Chicken, skin removed, light meat (3 oz/85 g) / 1 g Dairy / Saturated Fat (g)  Avoid: Whole milk (1 cup) / 5 g  Choose: Low-fat milk, 2% (1 cup) / 3 g  Choose: Low-fat milk, 1% (1 cup) / 1.5 g  Choose: Skim milk (1 cup) / 0.3 g  Avoid: Hard cheese (1 oz/28 g) / 6 g  Choose: Skim milk cheese (1 oz/28 g) / 2 to 3 g  Avoid: Cottage cheese, 4% fat (1 cup) / 6.5 g  Choose: Low-fat cottage cheese, 1% fat (1 cup) / 1.5 g  Avoid: Ice cream (1 cup) / 9 g  Choose: Sherbet (1 cup) / 2.5 g  Choose: Nonfat frozen yogurt (1 cup) / 0.3 g  Choose: Frozen fruit bar / trace  Avoid: Whipped cream (1 tbs) / 3.5 g  Choose: Nondairy whipped topping (1 tbs) / 1 g Condiments / Saturated Fat (g)  Avoid: Mayonnaise (1 tbs) / 2 g  Choose: Low-fat mayonnaise (1 tbs) / 1 g  Avoid: Butter (1 tbs) / 7 g  Choose: Extra light margarine (1 tbs) / 1 g  Avoid: Coconut oil (1 tbs) / 11.8 g  Choose: Olive oil (1 tbs) / 1.8 g  Choose: Corn oil (1 tbs) / 1.7 g  Choose: Safflower oil (1 tbs) / 1.2 g  Choose: Sunflower oil (1 tbs) / 1.4 g  Choose: Soybean oil (1 tbs) / 2.4 g  Choose: Canola oil (1 tbs) / 1 g Document Released: 07/26/2005 Document Revised: 11/20/2012 Document Reviewed: 10/24/2013 [employer] Patient Information 2015 [employer]. This information is not intended to replace advice given to you by your health care provider. Make sure you discuss any questions you have with your health care provider.  

## 2015-03-13 NOTE — Progress Notes (Signed)
Name: Ana Randall   MRN: [id number]    DOB: Nov 13, 1933   Date:03/13/2015       Progress Note  Subjective  Chief Complaint  Chief Complaint  Patient presents with  . Rash    bilateral hands and arms onset 4-5 months.  Pt describes as itchy, bumpy red rash. Pt states you switched her cholestrol med 6 months ago and does not know if this is the cause?    HPI  [name] presents today to discuss a rash that started on the tops of her hands about 4-5 months ago and spread to her upper arms. Red to brown circular flat lesions that are itchy. She denies new laundry soap, washing soap, shampoos, bath washes, perfumes but has been using a new lotion. She is also wonder if it is related to the atorvastatin I have switched her to prior to that. Not associated with fevers, myalgias, arthralgias, contact with unusual plants.    Patient Active Problem List   Diagnosis Date Noted  . Adult hypothyroidism 03/13/2015  . Alopecia 03/13/2015  . Anxiety 03/13/2015  . Cannot sleep 03/13/2015  . Hemorrhoid 03/13/2015  . History of basal cell cancer 03/13/2015  . H/O cataract 03/13/2015  . BP (high blood pressure) 03/13/2015  . Elevated cholesterol with elevated triglycerides 03/13/2015  . Heart & renal disease, hypertensive, with heart failure 03/13/2015  . Osteoporosis, post-menopausal 03/13/2015  . Post menopausal syndrome 03/13/2015  . Avitaminosis D 03/13/2015  . H/O neoplasm 11/25/2014    History  Substance Use Topics  . Smoking status: Never Smoker   . Smokeless tobacco: Never Used  . Alcohol Use: No     Current outpatient prescriptions:  .  acetaminophen (TYLENOL) 500 MG tablet, Take 1,000 mg by mouth every 6 (six) hours as needed (Headache)., Disp: , Rfl:  .  ALPRAZolam (XANAX) 0.5 MG tablet, Take 0.5 mg by mouth at bedtime. , Disp: , Rfl:  .  Calcium Carbonate-Vitamin D (CALTRATE 600+D PO), Take 1 tablet by mouth daily., Disp: , Rfl:  .  cetirizine-pseudoephedrine  (ZYRTEC-D) 5-120 MG per tablet, Take 1 tablet by mouth daily as needed for allergies., Disp: , Rfl:  .  levothyroxine (SYNTHROID, LEVOTHROID) 25 MCG tablet, Take 25 mcg by mouth daily before breakfast., Disp: , Rfl:  .  losartan-hydrochlorothiazide (HYZAAR) 100-12.5 MG per tablet, Take 1 tablet by mouth daily., Disp: , Rfl:  .  Multiple Vitamin (MULTIVITAMIN WITH MINERALS) TABS, Take 1 tablet by mouth daily., Disp: , Rfl:  .  Omega-3 Fatty Acids (FISH OIL) 1200 MG CAPS, Take 1 capsule by mouth daily., Disp: , Rfl:  .  atorvastatin (LIPITOR) 20 MG tablet, Take 1 tablet by mouth daily., Disp: , Rfl:   Allergies  Allergen Reactions  . Azithromycin Nausea Only  . Amoxicillin-Pot Clavulanate Nausea And Vomiting    Review of Systems  Ten systems reviewed and is negative except as mentioned in HPI.  Objective  BP 132/78 mmHg  Pulse 80  Temp(Src) 98 F (36.7 C) (Oral)  Resp 16  Ht 5\' 3"  (1.6 m)  Wt 137 lb 1.6 oz (62.188 kg)  BMI 24.29 kg/m2  SpO2 96%  Body mass index is 24.29 kg/(m^2).   Physical Exam  Constitutional: Patient appears well-developed and well-nourished. In no distress.  HEENT:  - Head: Normocephalic and atraumatic.  - Ears: Bilateral TMs gray, no erythema or effusion - Nose: Nasal mucosa moist - Mouth/Throat: Oropharynx is clear and moist. No tonsillar hypertrophy or erythema. No  post nasal drainage.  - Eyes: Conjunctivae clear, EOM movements normal. PERRLA. No scleral icterus.  Neck: Normal range of motion. Neck supple. No JVD present. No thyromegaly present.  Cardiovascular: Normal rate, regular rhythm and normal heart sounds.  No murmur heard.  Pulmonary/Chest: Effort normal and breath sounds normal. No respiratory distress. Musculoskeletal: Normal range of motion bilateral UE and LE, no joint effusions. Peripheral vascular: Bilateral LE no edema. Neurological: CN II-XII grossly intact with no focal deficits. Alert and oriented to person, place, and time.  Coordination, balance, strength, speech and gait are normal.  Skin: Skin is warm and dry. Hyperpigmented brown circular lesions measuring about 61mm to 25mm on dorsum of bilateral hands concentrated there then dissipating up arms. Psychiatric: Patient has a normal mood and affect. Behavior is normal in office today. Judgment and thought content normal in office today.     Assessment & Plan 1. Pruritic rash Identify causative agent. She may strop Atorvastatin for 1-2 months and see if the rash resolves. If not she is to go back on the medication.   - hydrocortisone 2.5 % cream; Apply topically 2 (two) times daily.  Dispense: 30 g; Refill: 0  2. Elevated cholesterol with elevated triglycerides Stop atorvastatin for now. Printed out diet recommendations to help control her cholesterol with lifestyle changes.

## 2015-03-18 DIAGNOSIS — L309 Dermatitis, unspecified: Secondary | ICD-10-CM | POA: Diagnosis not present

## 2015-03-18 DIAGNOSIS — L986 Other infiltrative disorders of the skin and subcutaneous tissue: Secondary | ICD-10-CM | POA: Diagnosis not present

## 2015-03-27 ENCOUNTER — Other Ambulatory Visit: Payer: Self-pay | Admitting: Family Medicine

## 2015-03-27 DIAGNOSIS — E034 Atrophy of thyroid (acquired): Secondary | ICD-10-CM

## 2015-03-27 DIAGNOSIS — I10 Essential (primary) hypertension: Secondary | ICD-10-CM

## 2015-04-11 ENCOUNTER — Encounter: Payer: Self-pay | Admitting: Family Medicine

## 2015-04-11 ENCOUNTER — Ambulatory Visit (INDEPENDENT_AMBULATORY_CARE_PROVIDER_SITE_OTHER): Payer: Medicare Other | Admitting: Family Medicine

## 2015-04-11 VITALS — BP 124/72 | HR 76 | Temp 98.2°F | Resp 16 | Ht 60.0 in | Wt 135.5 lb

## 2015-04-11 DIAGNOSIS — R109 Unspecified abdominal pain: Secondary | ICD-10-CM

## 2015-04-11 DIAGNOSIS — Z23 Encounter for immunization: Secondary | ICD-10-CM | POA: Diagnosis not present

## 2015-04-11 LAB — POCT URINALYSIS DIPSTICK
BILIRUBIN UA: NEGATIVE
Blood, UA: NEGATIVE
GLUCOSE UA: NEGATIVE
Ketones, UA: NEGATIVE
NITRITE UA: NEGATIVE
PH UA: 7
Protein, UA: NEGATIVE
Spec Grav, UA: <=1.005
Urobilinogen, UA: 0.2

## 2015-04-11 MED ORDER — NAPROXEN 500 MG PO TABS: 500.0000 mg | ORAL_TABLET | Freq: Two times a day (BID) | ORAL | Status: DC

## 2015-04-11 MED ORDER — CIPROFLOXACIN HCL 250 MG PO TABS: 250.0000 mg | ORAL_TABLET | Freq: Two times a day (BID) | ORAL | Status: DC

## 2015-04-11 MED ORDER — BACLOFEN 20 MG PO TABS: 20.0000 mg | ORAL_TABLET | Freq: Three times a day (TID) | ORAL | Status: DC

## 2015-04-11 NOTE — Progress Notes (Signed)
Name: Ana Randall   MRN: [id number]    DOB: 01/17/1934   Date:04/11/2015       Progress Note  Subjective  Chief Complaint  Chief Complaint  Patient presents with  . Flank Pain    Left flank pain the past 2-3 days, urinary discomfort. Unchanged.    HPI  Left Flank Pain : started a few days ago, described as a aching pain, on left lower back/flank area - improves with ibuprofen and when she flexes her knee and hips towards her chest. No change in urinary frequency, no abdominal pain or dysuria. Pain is very localized. She had an UTI last December and is worried it could be a kidney infection again.   Patient Active Problem List   Diagnosis Date Noted  . Adult hypothyroidism 03/13/2015  . Alopecia 03/13/2015  . Anxiety 03/13/2015  . Cannot sleep 03/13/2015  . Hemorrhoid 03/13/2015  . History of basal cell cancer 03/13/2015  . H/O cataract 03/13/2015  . BP (high blood pressure) 03/13/2015  . Elevated cholesterol with elevated triglycerides 03/13/2015  . Heart & renal disease, hypertensive, with heart failure 03/13/2015  . Osteoporosis, post-menopausal 03/13/2015  . Post menopausal syndrome 03/13/2015  . Avitaminosis D 03/13/2015  . Pruritic rash 03/13/2015  . H/O neoplasm 11/25/2014    Past Surgical History  Procedure Laterality Date  . Cataract extraction Right 04/14/2011  . Abdominal hysterectomy  1974    vaginal: partial  . Cataract extraction Left 10/19/2010    Family History  Problem Relation Age of Onset  . Diabetes    . Hypertension Mother   . Hypertension Father   . Diabetes Sister     Social History   Social History  . Marital Status: Married    Spouse Name: N/A  . Number of Children: N/A  . Years of Education: N/A   Occupational History  . retired    Social History Main Topics  . Smoking status: Never Smoker   . Smokeless tobacco: Never Used  . Alcohol Use: No  . Drug Use: No  . Sexual Activity: Not Currently   Other Topics Concern  . Not on  file   Social History Narrative     Current outpatient prescriptions:  .  acetaminophen (TYLENOL) 500 MG tablet, Take 1,000 mg by mouth every 6 (six) hours as needed (Headache)., Disp: , Rfl:  .  ALPRAZolam (XANAX) 0.5 MG tablet, Take 0.5 mg by mouth at bedtime. , Disp: , Rfl:  .  Calcium Carbonate-Vitamin D (CALTRATE 600+D PO), Take 1 tablet by mouth daily., Disp: , Rfl:  .  cetirizine-pseudoephedrine (ZYRTEC-D) 5-120 MG per tablet, Take 1 tablet by mouth daily as needed for allergies., Disp: , Rfl:  .  Cholecalciferol (VITAMIN D3) 1000 UNITS CAPS, Take by mouth., Disp: , Rfl:  .  clobetasol cream (TEMOVATE) 0.05 %, Apply topically., Disp: , Rfl:  .  DHA-EPA-VITAMIN E PO, Take by mouth., Disp: , Rfl:  .  hydrocortisone 2.5 % cream, Apply topically 2 (two) times daily., Disp: 30 g, Rfl: 0 .  levothyroxine (SYNTHROID, LEVOTHROID) 50 MCG tablet, Take 1 tablet by mouth  daily in the morning on an  empty stomach, Disp: 90 tablet, Rfl: 2 .  losartan-hydrochlorothiazide (HYZAAR) 100-12.5 MG per tablet, Take 1 tablet by mouth  daily, Disp: 90 tablet, Rfl: 2 .  Multiple Vitamins-Minerals (MULTIVITAMIN ADULT PO), Take by mouth., Disp: , Rfl:  .  Omega-3 Fatty Acids (FISH OIL) 1200 MG CAPS, Take 1 capsule by mouth daily.,  Disp: , Rfl:   Allergies  Allergen Reactions  . Azithromycin Nausea Only  . Sulfa Antibiotics Other (See Comments)  . Amoxicillin-Pot Clavulanate Nausea And Vomiting     ROS  Ten systems reviewed and is negative except for rash, still present, seen by Dermatologist, and mentioned in HPI    Objective  Filed Vitals:   04/11/15 1009  BP: 124/72  Pulse: 76  Temp: 98.2 F (36.8 C)  TempSrc: Oral  Resp: 16  Height: 5' (1.524 m)  Weight: 135 lb 8 oz (61.462 kg)  SpO2: 96%    Body mass index is 26.46 kg/(m^2).  Physical Exam   Constitutional: Patient appears well-developed and well-nourished. Obese  No distress.  HEENT: head atraumatic, normocephalic, pupils equal  and reactive to light,, neck supple, throat within normal limits Cardiovascular: Normal rate, regular rhythm and normal heart sounds.  No murmur heard. No BLE edema. Pulmonary/Chest: Effort normal and breath sounds normal. No respiratory distress. Abdominal: Soft.  There is no tenderness. Psychiatric: Patient has a normal mood and affect. behavior is normal. Judgment and thought content normal. Muscular Skeletal: negative CVA tenderness , pain during palpation of muscle below  lower posterior rib cage, no rashes, no paraspinal motion tenderness, normal rom of spine  Recent Results (from the past 2160 hour(s))  POCT Urinalysis Dipstick     Status: Abnormal   Collection Time: 04/11/15 10:14 AM  Result Value Ref Range   Color, UA yellow    Clarity, UA clear    Glucose, UA neg    Bilirubin, UA neg    Ketones, UA neg    Spec Grav, UA <=1.005    Blood, UA neg    pH, UA 7.0    Protein, UA neg    Urobilinogen, UA 0.2    Nitrite, UA neg    Leukocytes, UA large (3+) (A) Negative    PHQ2/9: Depression screen PHQ 2/9 03/13/2015  Decreased Interest 0  Down, Depressed, Hopeless 1  PHQ - 2 Score 1    Fall Risk: Fall Risk  03/13/2015  Falls in the past year? No      Assessment & Plan  1. Left flank pain Unlikely from kidney, but we will treat empirically and give also medication for muscle pain, return if no improvement for further evaluation  - POCT Urinalysis Dipstick - Urine culture - baclofen (LIORESAL) 20 MG tablet; Take 1 tablet (20 mg total) by mouth 3 (three) times daily.  Dispense: 30 each; Refill: 0 - naproxen (NAPROSYN) 500 MG tablet; Take 1 tablet (500 mg total) by mouth 2 (two) times daily with a meal.  Dispense: 30 tablet; Refill: 0 - ciprofloxacin (CIPRO) 250 MG tablet; Take 1 tablet (250 mg total) by mouth 2 (two) times daily.  Dispense: 6 tablet; Refill: 0  2. Needs flu shot  - Flu vaccine HIGH DOSE PF (Fluzone High dose)  3. Need for pneumococcal vaccination  -  Pneumococcal conjugate vaccine 13-valent IM

## 2015-04-15 DIAGNOSIS — R109 Unspecified abdominal pain: Secondary | ICD-10-CM | POA: Diagnosis not present

## 2015-04-17 NOTE — Progress Notes (Signed)
Patient notified

## 2015-04-18 LAB — URINE CULTURE

## 2015-04-21 NOTE — Progress Notes (Signed)
Pt.notified

## 2015-04-22 DIAGNOSIS — L814 Other melanin hyperpigmentation: Secondary | ICD-10-CM | POA: Diagnosis not present

## 2015-04-22 DIAGNOSIS — L439 Lichen planus, unspecified: Secondary | ICD-10-CM | POA: Diagnosis not present

## 2015-06-26 ENCOUNTER — Other Ambulatory Visit: Payer: Self-pay | Admitting: Internal Medicine

## 2015-06-26 DIAGNOSIS — Z1231 Encounter for screening mammogram for malignant neoplasm of breast: Secondary | ICD-10-CM

## 2015-08-08 ENCOUNTER — Other Ambulatory Visit: Payer: Self-pay | Admitting: Internal Medicine

## 2015-08-08 ENCOUNTER — Ambulatory Visit
Admission: RE | Admit: 2015-08-08 | Discharge: 2015-08-08 | Disposition: A | Discharge status: Home / Self Care | Payer: Medicare Other | Source: Ambulatory Visit | Attending: Internal Medicine | Admitting: Internal Medicine

## 2015-08-08 DIAGNOSIS — Z1231 Encounter for screening mammogram for malignant neoplasm of breast: Secondary | ICD-10-CM | POA: Diagnosis present

## 2015-08-08 DIAGNOSIS — R928 Other abnormal and inconclusive findings on diagnostic imaging of breast: Secondary | ICD-10-CM | POA: Insufficient documentation

## 2015-08-13 ENCOUNTER — Other Ambulatory Visit: Payer: Self-pay | Admitting: Internal Medicine

## 2015-08-13 DIAGNOSIS — R928 Other abnormal and inconclusive findings on diagnostic imaging of breast: Secondary | ICD-10-CM

## 2015-08-22 ENCOUNTER — Ambulatory Visit
Admission: RE | Admit: 2015-08-22 | Discharge: 2015-08-22 | Disposition: A | Discharge status: Home / Self Care | Payer: Medicare Other | Source: Ambulatory Visit | Attending: Internal Medicine | Admitting: Internal Medicine

## 2015-08-22 DIAGNOSIS — R928 Other abnormal and inconclusive findings on diagnostic imaging of breast: Secondary | ICD-10-CM

## 2015-08-22 DIAGNOSIS — N63 Unspecified lump in breast: Secondary | ICD-10-CM | POA: Diagnosis present

## 2016-07-29 ENCOUNTER — Other Ambulatory Visit: Payer: Self-pay | Admitting: Internal Medicine

## 2016-07-29 DIAGNOSIS — Z1231 Encounter for screening mammogram for malignant neoplasm of breast: Secondary | ICD-10-CM

## 2016-09-08 ENCOUNTER — Ambulatory Visit
Admission: RE | Admit: 2016-09-08 | Discharge: 2016-09-08 | Disposition: A | Discharge status: Home / Self Care | Payer: Medicare Other | Source: Ambulatory Visit | Attending: Internal Medicine | Admitting: Internal Medicine

## 2016-09-08 DIAGNOSIS — Z1231 Encounter for screening mammogram for malignant neoplasm of breast: Secondary | ICD-10-CM | POA: Insufficient documentation

## 2017-09-15 ENCOUNTER — Other Ambulatory Visit: Payer: Self-pay | Admitting: Internal Medicine

## 2017-09-15 DIAGNOSIS — Z1231 Encounter for screening mammogram for malignant neoplasm of breast: Secondary | ICD-10-CM

## 2017-10-12 ENCOUNTER — Ambulatory Visit
Admission: RE | Admit: 2017-10-12 | Discharge: 2017-10-12 | Disposition: A | Discharge status: Home / Self Care | Payer: Medicare HMO | Source: Ambulatory Visit | Attending: Internal Medicine | Admitting: Internal Medicine

## 2017-10-12 DIAGNOSIS — Z1231 Encounter for screening mammogram for malignant neoplasm of breast: Secondary | ICD-10-CM | POA: Diagnosis not present

## 2017-12-28 DIAGNOSIS — E039 Hypothyroidism, unspecified: Secondary | ICD-10-CM | POA: Diagnosis not present

## 2017-12-28 DIAGNOSIS — I10 Essential (primary) hypertension: Secondary | ICD-10-CM | POA: Diagnosis not present

## 2017-12-28 DIAGNOSIS — R7309 Other abnormal glucose: Secondary | ICD-10-CM | POA: Diagnosis not present

## 2017-12-28 DIAGNOSIS — Z79899 Other long term (current) drug therapy: Secondary | ICD-10-CM | POA: Diagnosis not present

## 2017-12-28 DIAGNOSIS — E782 Mixed hyperlipidemia: Secondary | ICD-10-CM | POA: Diagnosis not present

## 2018-01-04 DIAGNOSIS — Z79899 Other long term (current) drug therapy: Secondary | ICD-10-CM | POA: Diagnosis not present

## 2018-01-04 DIAGNOSIS — E039 Hypothyroidism, unspecified: Secondary | ICD-10-CM | POA: Diagnosis not present

## 2018-01-04 DIAGNOSIS — F419 Anxiety disorder, unspecified: Secondary | ICD-10-CM | POA: Diagnosis not present

## 2018-01-04 DIAGNOSIS — N183 Chronic kidney disease, stage 3 (moderate): Secondary | ICD-10-CM | POA: Diagnosis not present

## 2018-01-04 DIAGNOSIS — Z0000 Encounter for general adult medical examination without abnormal findings: Secondary | ICD-10-CM | POA: Diagnosis not present

## 2018-01-04 DIAGNOSIS — I10 Essential (primary) hypertension: Secondary | ICD-10-CM | POA: Diagnosis not present

## 2018-01-04 DIAGNOSIS — E782 Mixed hyperlipidemia: Secondary | ICD-10-CM | POA: Diagnosis not present

## 2018-01-04 DIAGNOSIS — R7309 Other abnormal glucose: Secondary | ICD-10-CM | POA: Diagnosis not present

## 2018-03-09 DIAGNOSIS — H524 Presbyopia: Secondary | ICD-10-CM | POA: Diagnosis not present

## 2018-06-21 DIAGNOSIS — Z1283 Encounter for screening for malignant neoplasm of skin: Secondary | ICD-10-CM | POA: Diagnosis not present

## 2018-06-21 DIAGNOSIS — L578 Other skin changes due to chronic exposure to nonionizing radiation: Secondary | ICD-10-CM | POA: Diagnosis not present

## 2018-06-21 DIAGNOSIS — L2389 Allergic contact dermatitis due to other agents: Secondary | ICD-10-CM | POA: Diagnosis not present

## 2018-06-21 DIAGNOSIS — Z86018 Personal history of other benign neoplasm: Secondary | ICD-10-CM | POA: Diagnosis not present

## 2018-07-05 DIAGNOSIS — Z79899 Other long term (current) drug therapy: Secondary | ICD-10-CM | POA: Diagnosis not present

## 2018-07-05 DIAGNOSIS — E782 Mixed hyperlipidemia: Secondary | ICD-10-CM | POA: Diagnosis not present

## 2018-07-05 DIAGNOSIS — E039 Hypothyroidism, unspecified: Secondary | ICD-10-CM | POA: Diagnosis not present

## 2018-07-05 DIAGNOSIS — R7309 Other abnormal glucose: Secondary | ICD-10-CM | POA: Diagnosis not present

## 2018-07-05 DIAGNOSIS — I10 Essential (primary) hypertension: Secondary | ICD-10-CM | POA: Diagnosis not present

## 2018-07-12 DIAGNOSIS — E039 Hypothyroidism, unspecified: Secondary | ICD-10-CM | POA: Diagnosis not present

## 2018-07-12 DIAGNOSIS — Z0000 Encounter for general adult medical examination without abnormal findings: Secondary | ICD-10-CM | POA: Diagnosis not present

## 2018-07-12 DIAGNOSIS — R7309 Other abnormal glucose: Secondary | ICD-10-CM | POA: Diagnosis not present

## 2018-07-12 DIAGNOSIS — Z1239 Encounter for other screening for malignant neoplasm of breast: Secondary | ICD-10-CM | POA: Diagnosis not present

## 2018-07-12 DIAGNOSIS — Z79899 Other long term (current) drug therapy: Secondary | ICD-10-CM | POA: Diagnosis not present

## 2018-07-12 DIAGNOSIS — I10 Essential (primary) hypertension: Secondary | ICD-10-CM | POA: Diagnosis not present

## 2018-07-12 DIAGNOSIS — N183 Chronic kidney disease, stage 3 (moderate): Secondary | ICD-10-CM | POA: Diagnosis not present

## 2018-07-12 DIAGNOSIS — F419 Anxiety disorder, unspecified: Secondary | ICD-10-CM | POA: Diagnosis not present

## 2018-07-12 DIAGNOSIS — E782 Mixed hyperlipidemia: Secondary | ICD-10-CM | POA: Diagnosis not present

## 2018-07-18 ENCOUNTER — Other Ambulatory Visit: Payer: Self-pay | Admitting: Internal Medicine

## 2018-07-18 DIAGNOSIS — Z1231 Encounter for screening mammogram for malignant neoplasm of breast: Secondary | ICD-10-CM

## 2018-10-23 ENCOUNTER — Ambulatory Visit
Admission: RE | Admit: 2018-10-23 | Discharge: 2018-10-23 | Disposition: A | Discharge status: Home / Self Care | Payer: Medicare HMO | Source: Ambulatory Visit | Attending: Internal Medicine | Admitting: Internal Medicine

## 2018-10-23 ENCOUNTER — Other Ambulatory Visit: Payer: Self-pay

## 2018-10-23 DIAGNOSIS — Z1231 Encounter for screening mammogram for malignant neoplasm of breast: Secondary | ICD-10-CM | POA: Diagnosis present

## 2019-01-10 DIAGNOSIS — E782 Mixed hyperlipidemia: Secondary | ICD-10-CM | POA: Diagnosis not present

## 2019-01-10 DIAGNOSIS — Z79899 Other long term (current) drug therapy: Secondary | ICD-10-CM | POA: Diagnosis not present

## 2019-01-10 DIAGNOSIS — I10 Essential (primary) hypertension: Secondary | ICD-10-CM | POA: Diagnosis not present

## 2019-01-10 DIAGNOSIS — E039 Hypothyroidism, unspecified: Secondary | ICD-10-CM | POA: Diagnosis not present

## 2019-01-10 DIAGNOSIS — R7309 Other abnormal glucose: Secondary | ICD-10-CM | POA: Diagnosis not present

## 2019-01-17 DIAGNOSIS — E039 Hypothyroidism, unspecified: Secondary | ICD-10-CM | POA: Diagnosis not present

## 2019-01-17 DIAGNOSIS — I10 Essential (primary) hypertension: Secondary | ICD-10-CM | POA: Diagnosis not present

## 2019-01-17 DIAGNOSIS — N183 Chronic kidney disease, stage 3 (moderate): Secondary | ICD-10-CM | POA: Diagnosis not present

## 2019-01-17 DIAGNOSIS — F419 Anxiety disorder, unspecified: Secondary | ICD-10-CM | POA: Diagnosis not present

## 2019-01-17 DIAGNOSIS — E782 Mixed hyperlipidemia: Secondary | ICD-10-CM | POA: Diagnosis not present

## 2019-01-17 DIAGNOSIS — Z79899 Other long term (current) drug therapy: Secondary | ICD-10-CM | POA: Diagnosis not present

## 2019-01-17 DIAGNOSIS — L989 Disorder of the skin and subcutaneous tissue, unspecified: Secondary | ICD-10-CM | POA: Diagnosis not present

## 2019-01-17 DIAGNOSIS — Z0000 Encounter for general adult medical examination without abnormal findings: Secondary | ICD-10-CM | POA: Diagnosis not present

## 2019-01-17 DIAGNOSIS — R7309 Other abnormal glucose: Secondary | ICD-10-CM | POA: Diagnosis not present

## 2019-03-21 DIAGNOSIS — H524 Presbyopia: Secondary | ICD-10-CM | POA: Diagnosis not present

## 2019-05-04 DIAGNOSIS — R21 Rash and other nonspecific skin eruption: Secondary | ICD-10-CM | POA: Diagnosis not present

## 2019-05-04 DIAGNOSIS — L814 Other melanin hyperpigmentation: Secondary | ICD-10-CM | POA: Diagnosis not present

## 2019-05-04 DIAGNOSIS — L821 Other seborrheic keratosis: Secondary | ICD-10-CM | POA: Diagnosis not present

## 2019-07-18 DIAGNOSIS — R7309 Other abnormal glucose: Secondary | ICD-10-CM | POA: Diagnosis not present

## 2019-07-18 DIAGNOSIS — E039 Hypothyroidism, unspecified: Secondary | ICD-10-CM | POA: Diagnosis not present

## 2019-07-18 DIAGNOSIS — Z79899 Other long term (current) drug therapy: Secondary | ICD-10-CM | POA: Diagnosis not present

## 2019-07-18 DIAGNOSIS — E782 Mixed hyperlipidemia: Secondary | ICD-10-CM | POA: Diagnosis not present

## 2019-07-18 DIAGNOSIS — I10 Essential (primary) hypertension: Secondary | ICD-10-CM | POA: Diagnosis not present

## 2019-07-25 ENCOUNTER — Other Ambulatory Visit: Payer: Self-pay | Admitting: Internal Medicine

## 2019-07-25 DIAGNOSIS — I129 Hypertensive chronic kidney disease with stage 1 through stage 4 chronic kidney disease, or unspecified chronic kidney disease: Secondary | ICD-10-CM | POA: Diagnosis not present

## 2019-07-25 DIAGNOSIS — E782 Mixed hyperlipidemia: Secondary | ICD-10-CM | POA: Diagnosis not present

## 2019-07-25 DIAGNOSIS — R7309 Other abnormal glucose: Secondary | ICD-10-CM | POA: Diagnosis not present

## 2019-07-25 DIAGNOSIS — E039 Hypothyroidism, unspecified: Secondary | ICD-10-CM | POA: Diagnosis not present

## 2019-07-25 DIAGNOSIS — Z79899 Other long term (current) drug therapy: Secondary | ICD-10-CM | POA: Diagnosis not present

## 2019-07-25 DIAGNOSIS — Z0000 Encounter for general adult medical examination without abnormal findings: Secondary | ICD-10-CM | POA: Diagnosis not present

## 2019-07-25 DIAGNOSIS — F419 Anxiety disorder, unspecified: Secondary | ICD-10-CM | POA: Diagnosis not present

## 2019-07-25 DIAGNOSIS — Z1231 Encounter for screening mammogram for malignant neoplasm of breast: Secondary | ICD-10-CM

## 2019-07-25 DIAGNOSIS — N1831 Chronic kidney disease, stage 3a: Secondary | ICD-10-CM | POA: Diagnosis not present

## 2020-01-16 DIAGNOSIS — Z79899 Other long term (current) drug therapy: Secondary | ICD-10-CM | POA: Diagnosis not present

## 2020-01-16 DIAGNOSIS — E039 Hypothyroidism, unspecified: Secondary | ICD-10-CM | POA: Diagnosis not present

## 2020-01-16 DIAGNOSIS — R7309 Other abnormal glucose: Secondary | ICD-10-CM | POA: Diagnosis not present

## 2020-01-16 DIAGNOSIS — E782 Mixed hyperlipidemia: Secondary | ICD-10-CM | POA: Diagnosis not present

## 2020-01-16 DIAGNOSIS — I10 Essential (primary) hypertension: Secondary | ICD-10-CM | POA: Diagnosis not present

## 2020-01-23 DIAGNOSIS — Z79899 Other long term (current) drug therapy: Secondary | ICD-10-CM | POA: Diagnosis not present

## 2020-01-23 DIAGNOSIS — E785 Hyperlipidemia, unspecified: Secondary | ICD-10-CM | POA: Diagnosis not present

## 2020-01-23 DIAGNOSIS — R739 Hyperglycemia, unspecified: Secondary | ICD-10-CM | POA: Diagnosis not present

## 2020-01-23 DIAGNOSIS — F419 Anxiety disorder, unspecified: Secondary | ICD-10-CM | POA: Diagnosis not present

## 2020-01-23 DIAGNOSIS — I129 Hypertensive chronic kidney disease with stage 1 through stage 4 chronic kidney disease, or unspecified chronic kidney disease: Secondary | ICD-10-CM | POA: Diagnosis not present

## 2020-01-23 DIAGNOSIS — N189 Chronic kidney disease, unspecified: Secondary | ICD-10-CM | POA: Diagnosis not present

## 2020-01-23 DIAGNOSIS — E039 Hypothyroidism, unspecified: Secondary | ICD-10-CM | POA: Diagnosis not present

## 2020-01-23 DIAGNOSIS — Z0000 Encounter for general adult medical examination without abnormal findings: Secondary | ICD-10-CM | POA: Diagnosis not present

## 2020-05-07 DIAGNOSIS — H5213 Myopia, bilateral: Secondary | ICD-10-CM | POA: Diagnosis not present

## 2020-05-14 ENCOUNTER — Other Ambulatory Visit: Payer: Self-pay

## 2020-05-14 ENCOUNTER — Ambulatory Visit
Admission: RE | Admit: 2020-05-14 | Discharge: 2020-05-14 | Disposition: A | Discharge status: Home / Self Care | Payer: Medicare HMO | Source: Ambulatory Visit | Attending: Internal Medicine | Admitting: Internal Medicine

## 2020-05-14 DIAGNOSIS — Z1231 Encounter for screening mammogram for malignant neoplasm of breast: Secondary | ICD-10-CM | POA: Insufficient documentation

## 2020-06-20 DIAGNOSIS — Z961 Presence of intraocular lens: Secondary | ICD-10-CM | POA: Diagnosis not present

## 2020-06-20 DIAGNOSIS — H02831 Dermatochalasis of right upper eyelid: Secondary | ICD-10-CM | POA: Diagnosis not present

## 2020-06-20 DIAGNOSIS — H26491 Other secondary cataract, right eye: Secondary | ICD-10-CM | POA: Diagnosis not present

## 2020-06-20 DIAGNOSIS — H18413 Arcus senilis, bilateral: Secondary | ICD-10-CM | POA: Diagnosis not present

## 2020-08-06 DIAGNOSIS — R7309 Other abnormal glucose: Secondary | ICD-10-CM | POA: Diagnosis not present

## 2020-08-06 DIAGNOSIS — I10 Essential (primary) hypertension: Secondary | ICD-10-CM | POA: Diagnosis not present

## 2020-08-06 DIAGNOSIS — E039 Hypothyroidism, unspecified: Secondary | ICD-10-CM | POA: Diagnosis not present

## 2020-08-06 DIAGNOSIS — E782 Mixed hyperlipidemia: Secondary | ICD-10-CM | POA: Diagnosis not present

## 2020-08-06 DIAGNOSIS — Z79899 Other long term (current) drug therapy: Secondary | ICD-10-CM | POA: Diagnosis not present

## 2020-08-13 DIAGNOSIS — Z79899 Other long term (current) drug therapy: Secondary | ICD-10-CM | POA: Diagnosis not present

## 2020-08-13 DIAGNOSIS — F419 Anxiety disorder, unspecified: Secondary | ICD-10-CM | POA: Diagnosis not present

## 2020-08-13 DIAGNOSIS — E782 Mixed hyperlipidemia: Secondary | ICD-10-CM | POA: Diagnosis not present

## 2020-08-13 DIAGNOSIS — Z0000 Encounter for general adult medical examination without abnormal findings: Secondary | ICD-10-CM | POA: Diagnosis not present

## 2020-08-13 DIAGNOSIS — I129 Hypertensive chronic kidney disease with stage 1 through stage 4 chronic kidney disease, or unspecified chronic kidney disease: Secondary | ICD-10-CM | POA: Diagnosis not present

## 2020-08-13 DIAGNOSIS — E039 Hypothyroidism, unspecified: Secondary | ICD-10-CM | POA: Diagnosis not present

## 2020-08-13 DIAGNOSIS — N1831 Chronic kidney disease, stage 3a: Secondary | ICD-10-CM | POA: Diagnosis not present

## 2020-08-13 DIAGNOSIS — R7301 Impaired fasting glucose: Secondary | ICD-10-CM | POA: Diagnosis not present

## 2020-09-16 DIAGNOSIS — L820 Inflamed seborrheic keratosis: Secondary | ICD-10-CM | POA: Diagnosis not present

## 2020-09-16 DIAGNOSIS — L821 Other seborrheic keratosis: Secondary | ICD-10-CM | POA: Diagnosis not present

## 2021-02-04 DIAGNOSIS — E039 Hypothyroidism, unspecified: Secondary | ICD-10-CM | POA: Diagnosis not present

## 2021-02-04 DIAGNOSIS — R7309 Other abnormal glucose: Secondary | ICD-10-CM | POA: Diagnosis not present

## 2021-02-04 DIAGNOSIS — I10 Essential (primary) hypertension: Secondary | ICD-10-CM | POA: Diagnosis not present

## 2021-02-04 DIAGNOSIS — Z79899 Other long term (current) drug therapy: Secondary | ICD-10-CM | POA: Diagnosis not present

## 2021-02-04 DIAGNOSIS — E782 Mixed hyperlipidemia: Secondary | ICD-10-CM | POA: Diagnosis not present

## 2021-02-11 DIAGNOSIS — E079 Disorder of thyroid, unspecified: Secondary | ICD-10-CM | POA: Diagnosis not present

## 2021-02-11 DIAGNOSIS — N189 Chronic kidney disease, unspecified: Secondary | ICD-10-CM | POA: Diagnosis not present

## 2021-02-11 DIAGNOSIS — E785 Hyperlipidemia, unspecified: Secondary | ICD-10-CM | POA: Diagnosis not present

## 2021-02-11 DIAGNOSIS — Z79899 Other long term (current) drug therapy: Secondary | ICD-10-CM | POA: Diagnosis not present

## 2021-02-11 DIAGNOSIS — R7309 Other abnormal glucose: Secondary | ICD-10-CM | POA: Diagnosis not present

## 2021-02-11 DIAGNOSIS — Z0000 Encounter for general adult medical examination without abnormal findings: Secondary | ICD-10-CM | POA: Diagnosis not present

## 2021-02-11 DIAGNOSIS — I129 Hypertensive chronic kidney disease with stage 1 through stage 4 chronic kidney disease, or unspecified chronic kidney disease: Secondary | ICD-10-CM | POA: Diagnosis not present

## 2021-04-09 ENCOUNTER — Other Ambulatory Visit: Payer: Self-pay | Admitting: Internal Medicine

## 2021-07-23 ENCOUNTER — Encounter: Payer: Self-pay | Admitting: Gastroenterology

## 2021-12-08 ENCOUNTER — Ambulatory Visit: Payer: Medicare Other | Admitting: Dermatology

## 2021-12-08 DIAGNOSIS — K130 Diseases of lips: Secondary | ICD-10-CM | POA: Diagnosis not present

## 2021-12-08 DIAGNOSIS — L578 Other skin changes due to chronic exposure to nonionizing radiation: Secondary | ICD-10-CM | POA: Diagnosis not present

## 2021-12-08 DIAGNOSIS — L820 Inflamed seborrheic keratosis: Secondary | ICD-10-CM

## 2021-12-08 DIAGNOSIS — L570 Actinic keratosis: Secondary | ICD-10-CM | POA: Diagnosis not present

## 2021-12-08 DIAGNOSIS — D2261 Melanocytic nevi of right upper limb, including shoulder: Secondary | ICD-10-CM | POA: Diagnosis not present

## 2021-12-08 DIAGNOSIS — L814 Other melanin hyperpigmentation: Secondary | ICD-10-CM

## 2021-12-08 DIAGNOSIS — L821 Other seborrheic keratosis: Secondary | ICD-10-CM | POA: Diagnosis not present

## 2021-12-08 DIAGNOSIS — D229 Melanocytic nevi, unspecified: Secondary | ICD-10-CM

## 2021-12-08 NOTE — Patient Instructions (Addendum)
Actinic keratoses are precancerous spots that appear secondary to cumulative UV radiation exposure/sun exposure over time. They are chronic with expected duration over 1 year. A portion of actinic keratoses will progress to squamous cell carcinoma of the skin. It is not possible to reliably predict which spots will progress to skin cancer and so treatment is recommended to prevent development of skin cancer. ? ?Recommend daily broad spectrum sunscreen SPF 30+ to sun-exposed areas, reapply every 2 hours as needed.  ?Recommend staying in the shade or wearing long sleeves, sun glasses (UVA+UVB protection) and wide brim hats (4-inch brim around the entire circumference of the hat). ?Call for new or changing lesions.  ? ? ? ? ?Seborrheic Keratosis ? ?What causes seborrheic keratoses? ?Seborrheic keratoses are harmless, common skin growths that first appear during adult life.  As time goes by, more growths appear.  Some people may develop a large number of them.  Seborrheic keratoses appear on both covered and uncovered body parts.  They are not caused by sunlight.  The tendency to develop seborrheic keratoses can be inherited.  They vary in color from skin-colored to gray, brown, or even black.  They can be either smooth or have a rough, warty surface.   ?Seborrheic keratoses are superficial and look as if they were stuck on the skin.  Under the microscope this type of keratosis looks like layers upon layers of skin.  That is why at times the top layer may seem to fall off, but the rest of the growth remains and re-grows.   ? ?Treatment ?Seborrheic keratoses do not need to be treated, but can easily be removed in the office.  Seborrheic keratoses often cause symptoms when they rub on clothing or jewelry.  Lesions can be in the way of shaving.  If they become inflamed, they can cause itching, soreness, or burning.  Removal of a seborrheic keratosis can be accomplished by freezing, burning, or surgery. ?If any spot bleeds,  scabs, or grows rapidly, please return to have it checked, as these can be an indication of a skin cancer. ? ?Cryotherapy Aftercare ? ?Wash gently with soap and water everyday.   ?Apply Vaseline and Band-Aid daily until healed.  ? ? ? ?If You Need Anything After Your Visit ? ?If you have any questions or concerns for your doctor, please call our main line at [phone] and press option 4 to reach your doctor's medical assistant. If no one answers, please leave a voicemail as directed and we will return your call as soon as possible. Messages left after 4 pm will be answered the following business day.  ? ?You may also send [place] a message via MyChart. We typically respond to MyChart messages within 1-2 business days. ? ?For prescription refills, please ask your pharmacy to contact our office. Our fax number is [phone]. ? ?If you have an urgent issue when the clinic is closed that cannot wait until the next business day, you can page your doctor at the number below.   ? ?Please note that while we do our best to be available for urgent issues outside of office hours, we are not available 24/7.  ? ?If you have an urgent issue and are unable to reach [place], you may choose to seek medical care at your doctor's office, retail clinic, urgent care center, or emergency room. ? ?If you have a medical emergency, please immediately call 911 or go to the emergency department. ? ?Pager Numbers ? ?- Dr. Nehemiah Massed: [phone] ? ?-  Dr. Laurence Ferrari: [phone] ? ?- Dr. Nicole Kindred: [phone] ? ?In the event of inclement weather, please call our main line at [phone] for an update on the status of any delays or closures. ? ?Dermatology Medication Tips: ?Please keep the boxes that topical medications come in in order to help keep track of the instructions about where and how to use these. Pharmacies typically print the medication instructions only on the boxes and not directly on the medication tubes.  ? ?If your medication is too  expensive, please contact our office at [phone] option 4 or send [place] a message through [place].  ? ?We are unable to tell what your co-pay for medications will be in advance as this is different depending on your insurance coverage. However, we may be able to find a substitute medication at lower cost or fill out paperwork to get insurance to cover a needed medication.  ? ?If a prior authorization is required to get your medication covered by your insurance company, please allow [place] 1-2 business days to complete this process. ? ?Drug prices often vary depending on where the prescription is filled and some pharmacies may offer cheaper prices. ? ?The website www.goodrx.com contains coupons for medications through different pharmacies. The prices here do not account for what the cost may be with help from insurance (it may be cheaper with your insurance), but the website can give you the price if you did not use any insurance.  ?- You can print the associated coupon and take it with your prescription to the pharmacy.  ?- You may also stop by our office during regular business hours and pick up a GoodRx coupon card.  ?- If you need your prescription sent electronically to a different pharmacy, notify our office through [facility] or by phone at [phone] option 4. ? ? ? ? ?Si Usted Necesita Algo Despu?s de Su Visita ? ?Tambi?n puede enviarnos un mensaje a trav?s de MyChart. Por lo general respondemos a los mensajes de MyChart en el transcurso de 1 a 2 d?as h?biles. ? ?Para renovar recetas, por favor pida a su farmacia que se ponga en contacto con nuestra oficina. Nuestro n?mero de fax es el [phone]. ? ?Si tiene un asunto urgente cuando la cl?nica est? cerrada y que no puede esperar hasta el siguiente d?a h?bil, puede llamar/localizar a su doctor(a) al n?mero que aparece a continuaci?n.  ? ?Por favor, tenga en cuenta que aunque hacemos todo lo posible para estar disponibles para asuntos urgentes fuera  del horario de oficina, no estamos disponibles las 24 horas del d?a, los 7 d?as de la semana.  ? ?Si tiene un problema urgente y no puede comunicarse con nosotros, puede optar por buscar atenci?n m?dica  en el consultorio de su doctor(a), en una cl?nica privada, en un centro de atenci?n urgente o en una sala de emergencias. ? ?Si tiene Engineer, maintenance (IT) m?dica, por favor llame inmediatamente al 911 o vaya a la sala de emergencias. ? ?N?meros de b?per ? ?- Dr. Nehemiah Massed: [phone] ? ?- Dra. Moye: [phone] ? ?- Dra. Nicole Kindred: [phone] ? ?En caso de inclemencias del tiempo, por favor llame a nuestra l?nea principal al [phone] para una actualizaci?n sobre el estado de cualquier retraso o cierre. ? ?Consejos para la medicaci?n en dermatolog?a: ?Por favor, guarde las cajas en las que vienen los medicamentos de uso t?pico para ayudarle a seguir las instrucciones sobre d?nde y c?mo usarlos. Las farmacias generalmente imprimen las instrucciones del medicamento s?lo en las cajas y  no directamente en los tubos del medicamento.  ? ?Si su medicamento es muy caro, por favor, p?ngase en contacto con Zigmund Daniel llamando al [phone] y presione la opci?n 4 o env?enos un mensaje a trav?s de MyChart.  ? ?No podemos decirle cu?l ser? su copago por los medicamentos por adelantado ya que esto es diferente dependiendo de la cobertura de su seguro. Sin embargo, es posible que podamos encontrar un medicamento sustituto a Electrical engineer un formulario para que el seguro cubra el medicamento que se considera necesario.  ? ?Si se requiere Ardelia Mems autorizaci?n previa para que su compa??a de seguros [place] su medicamento, por favor perm?tanos de 1 a 2 d?as h?biles para completar este proceso. ? ?Los precios de los medicamentos var?an con frecuencia dependiendo del Environmental consultant de d?nde se surte la receta y alguna farmacias pueden ofrecer precios m?s baratos. ? ?El sitio web www.goodrx.com tiene cupones para medicamentos de Office manager. Los precios aqu? no tienen en cuenta lo que podr?a costar con la ayuda del seguro (puede ser m?s barato con su seguro), pero el sitio web puede darle el precio si no utiliz? ning?n seguro.

## 2021-12-08 NOTE — Progress Notes (Signed)
? ?New Patient Visit ? ?Subjective  ?Ana Randall is a 88 y.o. female who presents for the following: New Patient (Initial Visit) (Patient reports a spot under upper lip, she noticed 3 - 4 weeks ago.  She also has some spots at neck, right wrist, and itchy spots at arms. Patient denies personal or family history of skin cancer. ). ?  ?The following portions of the chart were reviewed this encounter and updated as appropriate:  ?  ?  ? ?Review of Systems:  No other skin or systemic complaints except as noted in HPI or Assessment and Plan. ? ?Objective  ?Well appearing patient in no apparent distress; mood and affect are within normal limits. ? ?A full examination was performed including scalp, head, eyes, ears, nose, lips, neck, chest, axillae, abdomen, back, buttocks, bilateral upper extremities, bilateral lower extremities, hands, feet, fingers, toes, fingernails, and toenails. All findings within normal limits unless otherwise noted below. ? ?right wrist x 1, right forearm x 2, left forearm x 1, left elbow x 1, left neck x 2 (7) ?Erythematous stuck-on, waxy papule or patch ? ?right elbow ?3 mm gray brown macule slighty waxy  ? ? ? ? ? ? ?left malar cheek x 1, nasal dorsum x 1 (2) ?Erythematous thin papules/macules with gritty scale.  ? ?right upper mucosal lip ?4 mm indistinct soft translucent papule  ? ? ? ?Assessment & Plan  ?Inflamed seborrheic keratosis (7) ?right wrist x 1, right forearm x 2, left forearm x 1, left elbow x 1, left neck x 2 ? ?Destruction of lesion - right wrist x 1, right forearm x 2, left forearm x 1, left elbow x 1, left neck x 2 ? ?Destruction method: cryotherapy   ?Informed consent: discussed and consent obtained   ?Lesion destroyed using liquid nitrogen: Yes   ?Region frozen until ice ball extended beyond lesion: Yes   ?Outcome: patient tolerated procedure well with no complications   ?Post-procedure details: wound care instructions given   ?Additional details:  Prior to procedure,  discussed risks of blister formation, small wound, skin dyspigmentation, or rare scar following cryotherapy. Recommend Vaseline ointment to treated areas while healing. ? ? ?Nevus ?right elbow ? ?Benign-appearing.  Observation.  Call clinic for new or changing lesions.  Recommend daily use of broad spectrum spf 30+ sunscreen to sun-exposed areas.  ? ?Recheck on f/up ? ?Actinic keratosis (2) ?left malar cheek x 1, nasal dorsum x 1 ? ?Actinic keratoses are precancerous spots that appear secondary to cumulative UV radiation exposure/sun exposure over time. They are chronic with expected duration over 1 year. A portion of actinic keratoses will progress to squamous cell carcinoma of the skin. It is not possible to reliably predict which spots will progress to skin cancer and so treatment is recommended to prevent development of skin cancer. ? ?Recommend daily broad spectrum sunscreen SPF 30+ to sun-exposed areas, reapply every 2 hours as needed.  ?Recommend staying in the shade or wearing long sleeves, sun glasses (UVA+UVB protection) and wide brim hats (4-inch brim around the entire circumference of the hat). ?Call for new or changing lesions. ? ?Destruction of lesion - left malar cheek x 1, nasal dorsum x 1 ? ?Destruction method: cryotherapy   ?Informed consent: discussed and consent obtained   ?Lesion destroyed using liquid nitrogen: Yes   ?Region frozen until ice ball extended beyond lesion: Yes   ?Outcome: patient tolerated procedure well with no complications   ?Post-procedure details: wound care instructions given   ?  Additional details:  Prior to procedure, discussed risks of blister formation, small wound, skin dyspigmentation, or rare scar following cryotherapy. Recommend Vaseline ointment to treated areas while healing. ? ? ?Mucocele of lip ?right upper mucosal lip ? ?Benign. Asymptomatic. Observe. ? ?Patient advised if continues to grow or become bothersome to follow up with dentist to see if it can be  removed.  ? ? ?Seborrheic Keratoses ?- Stuck-on, waxy, tan-brown papules and/or plaques  ?- Benign-appearing ?- Discussed benign etiology and prognosis. ?- Observe ?- Call for any changes ? ?Lentigines ?- Scattered tan macules ?- Due to sun exposure ?- Benign-appering, observe ?- Recommend daily broad spectrum sunscreen SPF 30+ to sun-exposed areas, reapply every 2 hours as needed. ?- Call for any changes ? ? ?Actinic Damage ?- chronic, secondary to cumulative UV radiation exposure/sun exposure over time ?- diffuse scaly erythematous macules with underlying dyspigmentation ?- Recommend daily broad spectrum sunscreen SPF 30+ to sun-exposed areas, reapply every 2 hours as needed.  ?- Recommend staying in the shade or wearing long sleeves, sun glasses (UVA+UVB protection) and wide brim hats (4-inch brim around the entire circumference of the hat). ?- Call for new or changing lesions. ? ?Return in about 2 months (around 02/07/2022) for isk/ak  follow up and recheck mole at right elbow. ? ? ?I, Ruthell Rummage, CMA, am acting as scribe for Brendolyn Patty, MD. ? ?Documentation: I have reviewed the above documentation for accuracy and completeness, and I agree with the above. ? ?Brendolyn Patty MD  ? ?

## 2021-12-31 IMAGING — MG DIGITAL SCREENING BILAT W/ TOMO W/ CAD
8 series · 8 of 24 positions shown · non-contrast
Comparison: Previous exam(s).

CLINICAL DATA: Screening.

EXAM:
DIGITAL SCREENING BILATERAL MAMMOGRAM WITH TOMO AND CAD

[R MLO synth-2D]
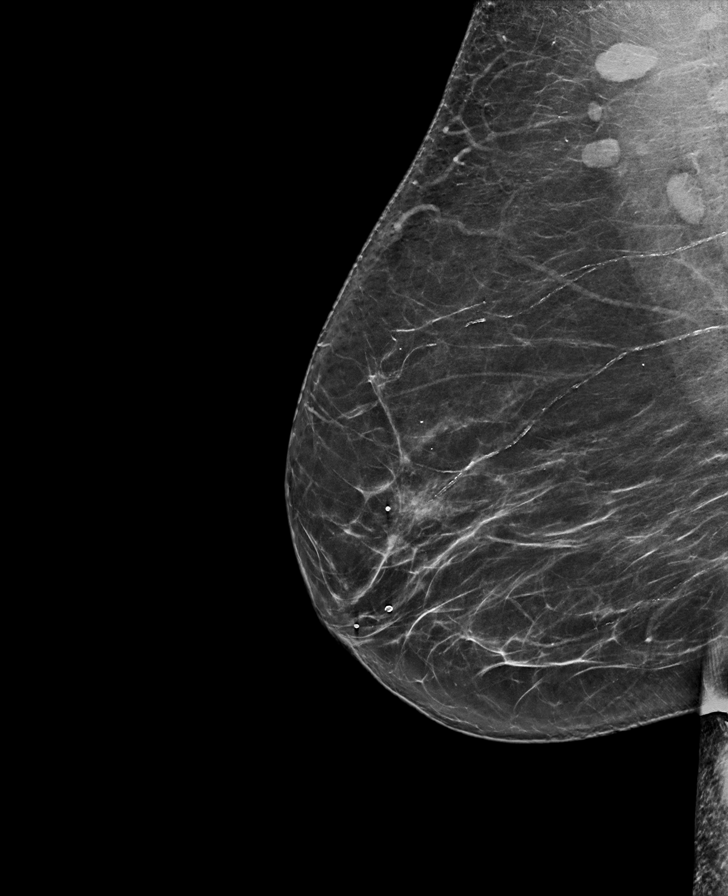

[R CC synth-2D]
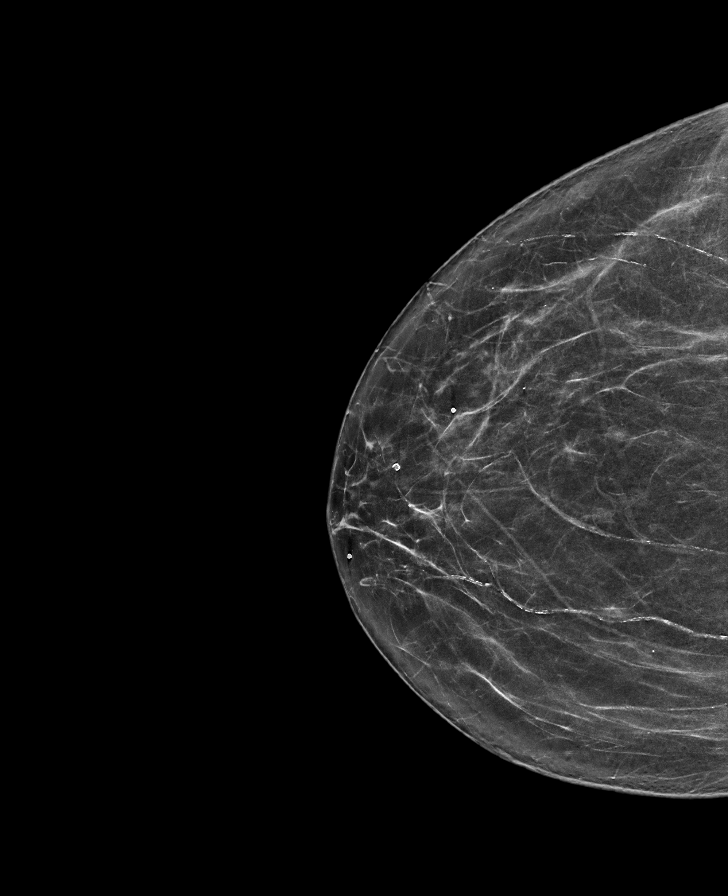

[L MLO synth-2D]
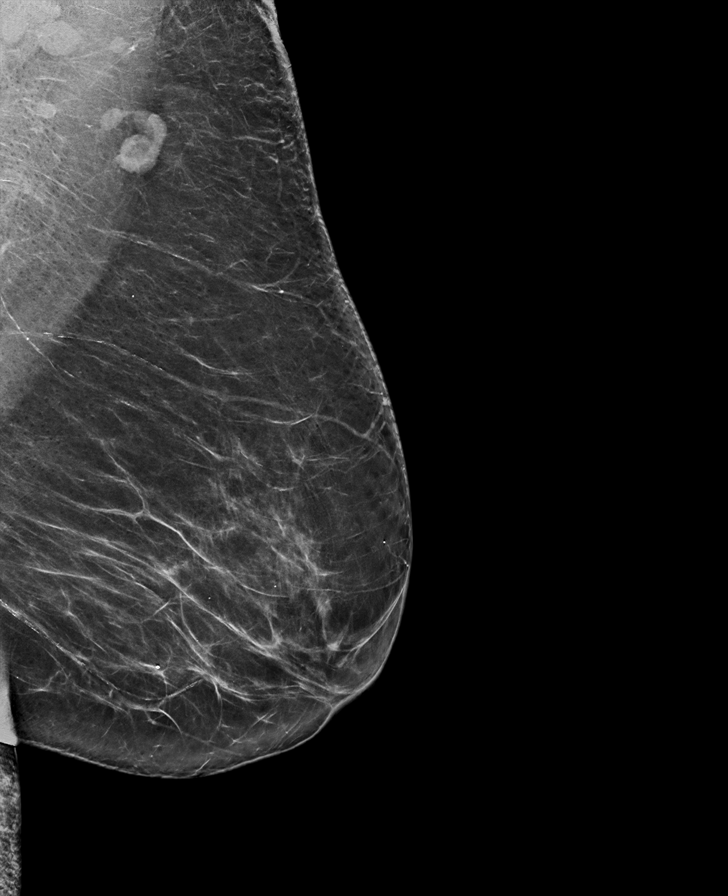

[L CC synth-2D]
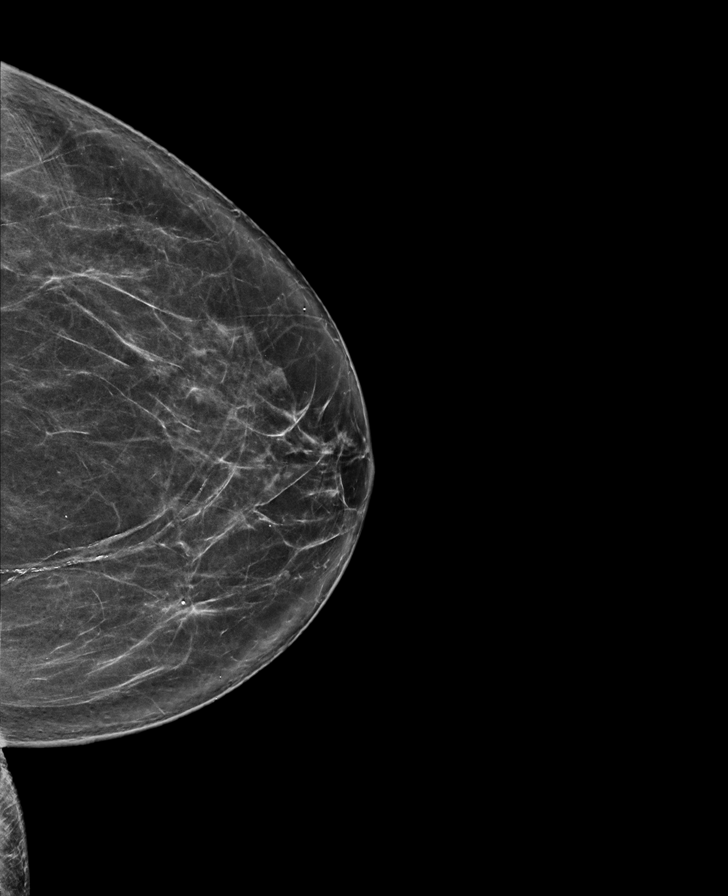

[R MLO tomo · tomo slice 38/75.0]
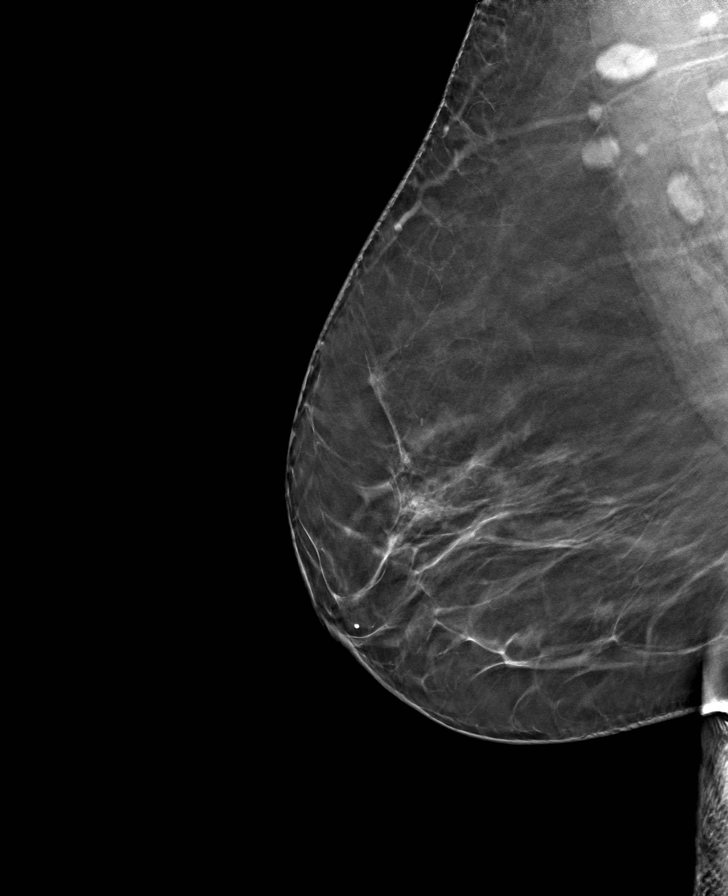

[L CC tomo · tomo slice 37/72.0]
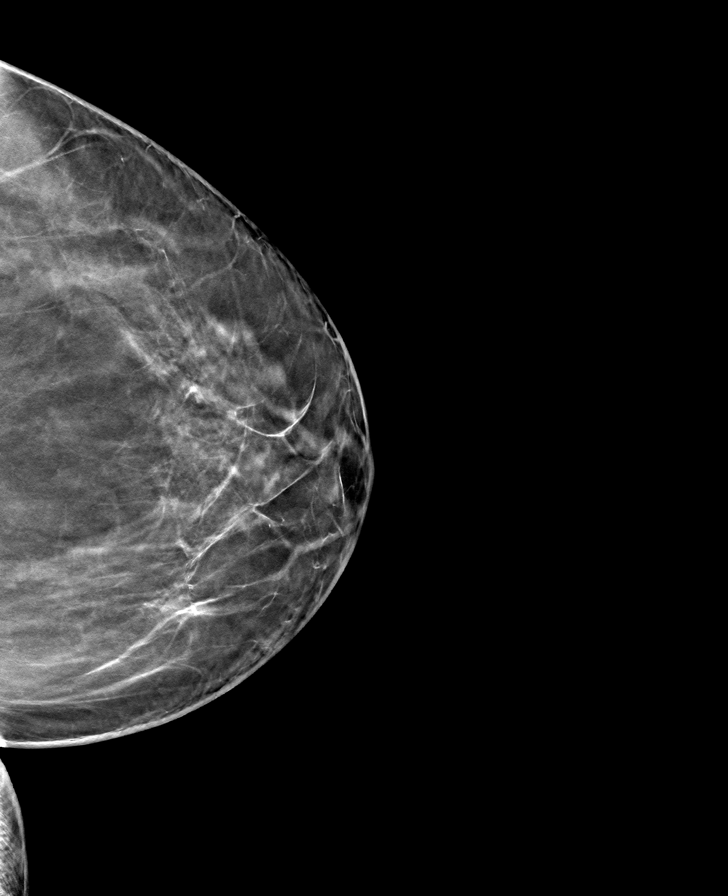

[L MLO tomo · tomo slice 40/79.0]
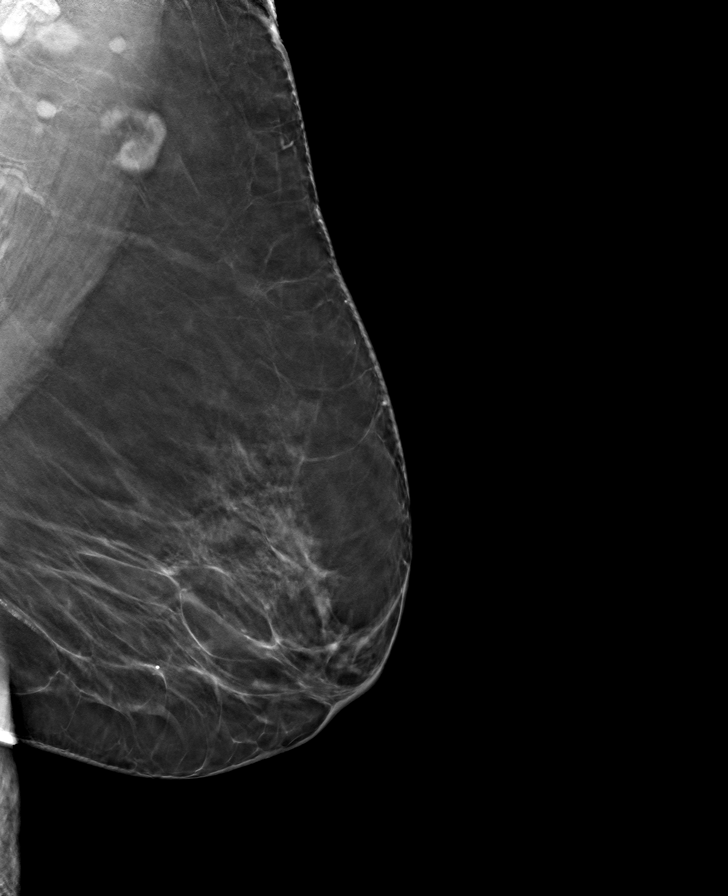

[R CC tomo · tomo slice 35/68.0]
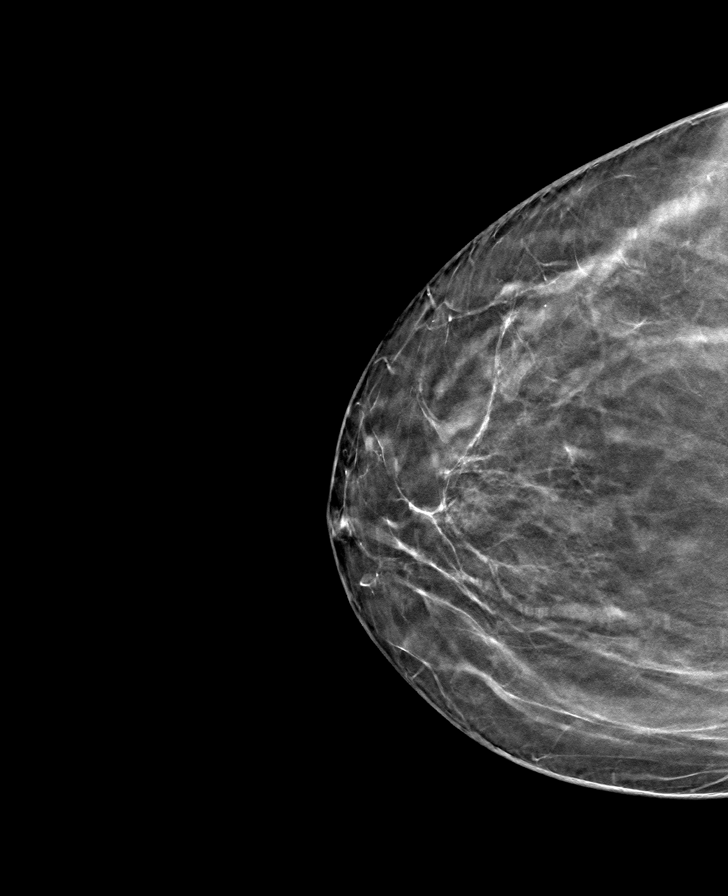

[8 of 24 positions shown; findings below may reference images not displayed]

ACR Breast Density Category b: There are scattered areas of
fibroglandular density.
FINDINGS: There are no findings suspicious for malignancy. Images were
processed with CAD.
IMPRESSION: No mammographic evidence of malignancy. A result letter of this
screening mammogram will be mailed directly to the patient.

RECOMMENDATION:
Screening mammogram in one year. (Code:CN-U-775)

BI-RADS CATEGORY  1: Negative.

## 2022-04-20 ENCOUNTER — Ambulatory Visit: Payer: Medicare Other | Admitting: Dermatology

## 2022-04-29 ENCOUNTER — Observation Stay
Admission: EM | Admit: 2022-04-29 | Discharge: 2022-04-30 | Disposition: A | Discharge status: Home / Self Care | Payer: Medicare Other | Attending: Surgery | Admitting: Surgery

## 2022-04-29 ENCOUNTER — Other Ambulatory Visit: Payer: Self-pay

## 2022-04-29 ENCOUNTER — Observation Stay: Payer: Medicare Other | Admitting: Anesthesiology

## 2022-04-29 ENCOUNTER — Emergency Department: Payer: Medicare Other

## 2022-04-29 ENCOUNTER — Encounter
Admission: EM | Disposition: A | Discharge status: Home / Self Care | Payer: Self-pay | Source: Home / Self Care | Attending: Emergency Medicine

## 2022-04-29 ENCOUNTER — Encounter: Payer: Self-pay | Admitting: Emergency Medicine

## 2022-04-29 DIAGNOSIS — N183 Chronic kidney disease, stage 3 unspecified: Secondary | ICD-10-CM | POA: Diagnosis not present

## 2022-04-29 DIAGNOSIS — K8000 Calculus of gallbladder with acute cholecystitis without obstruction: Secondary | ICD-10-CM

## 2022-04-29 DIAGNOSIS — E039 Hypothyroidism, unspecified: Secondary | ICD-10-CM | POA: Diagnosis not present

## 2022-04-29 DIAGNOSIS — I129 Hypertensive chronic kidney disease with stage 1 through stage 4 chronic kidney disease, or unspecified chronic kidney disease: Secondary | ICD-10-CM | POA: Insufficient documentation

## 2022-04-29 DIAGNOSIS — R109 Unspecified abdominal pain: Secondary | ICD-10-CM

## 2022-04-29 DIAGNOSIS — Z20822 Contact with and (suspected) exposure to covid-19: Secondary | ICD-10-CM | POA: Diagnosis not present

## 2022-04-29 DIAGNOSIS — Z79899 Other long term (current) drug therapy: Secondary | ICD-10-CM | POA: Insufficient documentation

## 2022-04-29 DIAGNOSIS — R112 Nausea with vomiting, unspecified: Secondary | ICD-10-CM | POA: Diagnosis present

## 2022-04-29 DIAGNOSIS — K8012 Calculus of gallbladder with acute and chronic cholecystitis without obstruction: Secondary | ICD-10-CM | POA: Diagnosis not present

## 2022-04-29 LAB — CBC WITH DIFFERENTIAL/PLATELET
Abs Immature Granulocytes: 0.08 10*3/uL — ABNORMAL HIGH (ref 0.00–0.07)
Basophils Absolute: 0.1 10*3/uL (ref 0.0–0.1)
Basophils Relative: 1 %
Eosinophils Absolute: 0.1 10*3/uL (ref 0.0–0.5)
Eosinophils Relative: 1 %
HCT: 42 % (ref 36.0–46.0)
Hemoglobin: 13.8 g/dL (ref 12.0–15.0)
Immature Granulocytes: 1 %
Lymphocytes Relative: 15 %
Lymphs Abs: 1.8 10*3/uL (ref 0.7–4.0)
MCH: 29.9 pg (ref 26.0–34.0)
MCHC: 32.9 g/dL (ref 30.0–36.0)
MCV: 91.1 fL (ref 80.0–100.0)
Monocytes Absolute: 0.4 10*3/uL (ref 0.1–1.0)
Monocytes Relative: 3 %
Neutro Abs: 9.3 10*3/uL — ABNORMAL HIGH (ref 1.7–7.7)
Neutrophils Relative %: 79 %
Platelets: 271 10*3/uL (ref 150–400)
RBC: 4.61 MIL/uL (ref 3.87–5.11)
RDW: 12.8 % (ref 11.5–15.5)
WBC: 11.7 10*3/uL — ABNORMAL HIGH (ref 4.0–10.5)
nRBC: 0 % (ref 0.0–0.2)

## 2022-04-29 LAB — URINALYSIS, ROUTINE W REFLEX MICROSCOPIC
Bacteria, UA: NONE SEEN
Bilirubin Urine: NEGATIVE
Glucose, UA: NEGATIVE mg/dL
Ketones, ur: NEGATIVE mg/dL
Leukocytes,Ua: NEGATIVE
Nitrite: NEGATIVE
Protein, ur: NEGATIVE mg/dL
Specific Gravity, Urine: 1.02 (ref 1.005–1.030)
pH: 6 (ref 5.0–8.0)

## 2022-04-29 LAB — COMPREHENSIVE METABOLIC PANEL
ALT: 19 U/L (ref 0–44)
AST: 21 U/L (ref 15–41)
Albumin: 3.9 g/dL (ref 3.5–5.0)
Alkaline Phosphatase: 37 U/L — ABNORMAL LOW (ref 38–126)
Anion gap: 7 (ref 5–15)
BUN: 17 mg/dL (ref 8–23)
CO2: 27 mmol/L (ref 22–32)
Calcium: 9.5 mg/dL (ref 8.9–10.3)
Chloride: 102 mmol/L (ref 98–111)
Creatinine, Ser: 0.98 mg/dL (ref 0.44–1.00)
GFR, Estimated: 57 mL/min — ABNORMAL LOW (ref >60)
Glucose, Bld: 145 mg/dL — ABNORMAL HIGH (ref 70–99)
Potassium: 4.4 mmol/L (ref 3.5–5.1)
Sodium: 136 mmol/L (ref 135–145)
Total Bilirubin: 0.6 mg/dL (ref 0.3–1.2)
Total Protein: 6.6 g/dL (ref 6.5–8.1)

## 2022-04-29 LAB — SARS CORONAVIRUS 2 BY RT PCR: SARS Coronavirus 2 by RT PCR: NEGATIVE

## 2022-04-29 LAB — LIPASE, BLOOD: Lipase: 44 U/L (ref 11–51)

## 2022-04-29 SURGERY — CHOLECYSTECTOMY, ROBOT-ASSISTED, LAPAROSCOPIC
Anesthesia: General

## 2022-04-29 MED ORDER — PHENYLEPHRINE HCL (PRESSORS) 10 MG/ML IV SOLN
INTRAVENOUS | Status: DC | PRN
  Administered 2022-04-29: 160 ug via INTRAVENOUS
  Administered 2022-04-29: 80 ug via INTRAVENOUS

## 2022-04-29 MED ORDER — HYDROMORPHONE HCL 1 MG/ML IJ SOLN
0.5000 mg | INTRAMUSCULAR | Status: DC | PRN
  Administered 2022-04-29: 0.5 mg via INTRAVENOUS
  Filled 2022-04-29: qty 0.5

## 2022-04-29 MED ORDER — ACETAMINOPHEN 500 MG PO TABS
1000.0000 mg | ORAL_TABLET | Freq: Four times a day (QID) | ORAL | Status: DC
  Administered 2022-04-29 – 2022-04-30 (×2): 1000 mg via ORAL
  Filled 2022-04-29 (×3): qty 2

## 2022-04-29 MED ORDER — PROPOFOL 10 MG/ML IV BOLUS
INTRAVENOUS | Status: AC
  Filled 2022-04-29: qty 20

## 2022-04-29 MED ORDER — ONDANSETRON HCL 4 MG/2ML IJ SOLN: 4.0000 mg | Freq: Once | INTRAMUSCULAR | Status: DC | PRN

## 2022-04-29 MED ORDER — LOSARTAN POTASSIUM-HCTZ 100-12.5 MG PO TABS: 1.0000 | ORAL_TABLET | Freq: Every day | ORAL | Status: DC

## 2022-04-29 MED ORDER — LOSARTAN POTASSIUM 50 MG PO TABS
100.0000 mg | ORAL_TABLET | Freq: Every day | ORAL | Status: DC
  Administered 2022-04-30: 100 mg via ORAL
  Filled 2022-04-29: qty 2

## 2022-04-29 MED ORDER — LACTATED RINGERS IV SOLN: INTRAVENOUS | Status: DC

## 2022-04-29 MED ORDER — INDOCYANINE GREEN 25 MG IV SOLR
2.5000 mg | INTRAVENOUS | Status: DC
  Filled 2022-04-29: qty 10

## 2022-04-29 MED ORDER — ROCURONIUM BROMIDE 100 MG/10ML IV SOLN
INTRAVENOUS | Status: DC | PRN
  Administered 2022-04-29: 10 mg via INTRAVENOUS
  Administered 2022-04-29: 30 mg via INTRAVENOUS

## 2022-04-29 MED ORDER — FENTANYL CITRATE (PF) 100 MCG/2ML IJ SOLN
INTRAMUSCULAR | Status: AC
  Filled 2022-04-29: qty 2

## 2022-04-29 MED ORDER — SODIUM CHLORIDE 0.9 % IR SOLN
Status: DC | PRN
  Administered 2022-04-29: 100 mL

## 2022-04-29 MED ORDER — ACETAMINOPHEN 10 MG/ML IV SOLN: 1000.0000 mg | Freq: Once | INTRAVENOUS | Status: DC | PRN

## 2022-04-29 MED ORDER — PROPOFOL 10 MG/ML IV BOLUS
INTRAVENOUS | Status: DC | PRN
  Administered 2022-04-29: 120 mg via INTRAVENOUS

## 2022-04-29 MED ORDER — SUGAMMADEX SODIUM 200 MG/2ML IV SOLN
INTRAVENOUS | Status: DC | PRN
  Administered 2022-04-29: 150 mg via INTRAVENOUS

## 2022-04-29 MED ORDER — HYDROMORPHONE HCL 1 MG/ML IJ SOLN: 0.5000 mg | INTRAMUSCULAR | Status: DC | PRN

## 2022-04-29 MED ORDER — KETOROLAC TROMETHAMINE 30 MG/ML IJ SOLN: 15.0000 mg | Freq: Four times a day (QID) | INTRAMUSCULAR | Status: DC | PRN

## 2022-04-29 MED ORDER — OXYCODONE HCL 5 MG PO TABS: 5.0000 mg | ORAL_TABLET | Freq: Once | ORAL | Status: DC | PRN

## 2022-04-29 MED ORDER — CHLORHEXIDINE GLUCONATE 0.12 % MT SOLN: 15.0000 mL | Freq: Once | OROMUCOSAL | Status: AC

## 2022-04-29 MED ORDER — FENTANYL CITRATE (PF) 100 MCG/2ML IJ SOLN: 25.0000 ug | INTRAMUSCULAR | Status: DC | PRN

## 2022-04-29 MED ORDER — BUPIVACAINE-EPINEPHRINE (PF) 0.25% -1:200000 IJ SOLN
INTRAMUSCULAR | Status: DC | PRN
  Administered 2022-04-29: 30 mL

## 2022-04-29 MED ORDER — OXYCODONE HCL 5 MG/5ML PO SOLN: 5.0000 mg | Freq: Once | ORAL | Status: DC | PRN

## 2022-04-29 MED ORDER — INDOCYANINE GREEN 25 MG IV SOLR
2.5000 mg | INTRAVENOUS | Status: AC
  Administered 2022-04-29: 2.5 mg via INTRAVENOUS
  Filled 2022-04-29: qty 1

## 2022-04-29 MED ORDER — KETAMINE HCL 10 MG/ML IJ SOLN
INTRAMUSCULAR | Status: DC | PRN
  Administered 2022-04-29: 50 mg via INTRAVENOUS

## 2022-04-29 MED ORDER — LIDOCAINE HCL (PF) 2 % IJ SOLN
INTRAMUSCULAR | Status: AC
  Filled 2022-04-29: qty 5

## 2022-04-29 MED ORDER — EPHEDRINE 5 MG/ML INJ
INTRAVENOUS | Status: AC
  Filled 2022-04-29: qty 5

## 2022-04-29 MED ORDER — SUCCINYLCHOLINE CHLORIDE 200 MG/10ML IV SOSY
PREFILLED_SYRINGE | INTRAVENOUS | Status: AC
  Filled 2022-04-29: qty 10

## 2022-04-29 MED ORDER — ROCURONIUM BROMIDE 10 MG/ML (PF) SYRINGE
PREFILLED_SYRINGE | INTRAVENOUS | Status: AC
  Filled 2022-04-29: qty 10

## 2022-04-29 MED ORDER — BUPIVACAINE-EPINEPHRINE (PF) 0.25% -1:200000 IJ SOLN
INTRAMUSCULAR | Status: AC
  Filled 2022-04-29: qty 30

## 2022-04-29 MED ORDER — LIDOCAINE HCL (CARDIAC) PF 100 MG/5ML IV SOSY
PREFILLED_SYRINGE | INTRAVENOUS | Status: DC | PRN
  Administered 2022-04-29: 60 mg via INTRAVENOUS

## 2022-04-29 MED ORDER — DEXAMETHASONE SODIUM PHOSPHATE 10 MG/ML IJ SOLN
INTRAMUSCULAR | Status: AC
  Filled 2022-04-29: qty 1

## 2022-04-29 MED ORDER — OXYCODONE HCL 5 MG PO TABS: 5.0000 mg | ORAL_TABLET | ORAL | Status: DC | PRN

## 2022-04-29 MED ORDER — FENTANYL CITRATE (PF) 100 MCG/2ML IJ SOLN
INTRAMUSCULAR | Status: DC | PRN
  Administered 2022-04-29 (×2): 50 ug via INTRAVENOUS

## 2022-04-29 MED ORDER — FENTANYL CITRATE PF 50 MCG/ML IJ SOSY
25.0000 ug | PREFILLED_SYRINGE | Freq: Once | INTRAMUSCULAR | Status: AC
  Administered 2022-04-29: 25 ug via INTRAVENOUS
  Filled 2022-04-29: qty 1

## 2022-04-29 MED ORDER — METRONIDAZOLE 500 MG/100ML IV SOLN
500.0000 mg | Freq: Two times a day (BID) | INTRAVENOUS | Status: DC
  Administered 2022-04-29 (×2): 500 mg via INTRAVENOUS
  Filled 2022-04-29 (×4): qty 100

## 2022-04-29 MED ORDER — LEVOTHYROXINE SODIUM 50 MCG PO TABS
50.0000 ug | ORAL_TABLET | Freq: Every day | ORAL | Status: DC
  Administered 2022-04-30: 50 ug via ORAL
  Filled 2022-04-29: qty 1

## 2022-04-29 MED ORDER — ONDANSETRON HCL 4 MG/2ML IJ SOLN
INTRAMUSCULAR | Status: AC
  Filled 2022-04-29: qty 2

## 2022-04-29 MED ORDER — SODIUM CHLORIDE 0.9 % IV SOLN: INTRAVENOUS | Status: DC

## 2022-04-29 MED ORDER — SODIUM CHLORIDE 0.9 % IV SOLN
12.5000 mg | Freq: Four times a day (QID) | INTRAVENOUS | Status: DC | PRN
  Administered 2022-04-29: 12.5 mg via INTRAVENOUS
  Filled 2022-04-29: qty 12.5

## 2022-04-29 MED ORDER — EPHEDRINE SULFATE (PRESSORS) 50 MG/ML IJ SOLN
INTRAMUSCULAR | Status: DC | PRN
  Administered 2022-04-29: 5 mg via INTRAVENOUS

## 2022-04-29 MED ORDER — SODIUM CHLORIDE 0.9 % IV BOLUS
500.0000 mL | Freq: Once | INTRAVENOUS | Status: AC
  Administered 2022-04-29: 500 mL via INTRAVENOUS

## 2022-04-29 MED ORDER — SODIUM CHLORIDE 0.9 % IV SOLN
2.0000 g | INTRAVENOUS | Status: DC
  Administered 2022-04-29: 2 g via INTRAVENOUS
  Filled 2022-04-29: qty 20
  Filled 2022-04-29: qty 2

## 2022-04-29 MED ORDER — CHLORHEXIDINE GLUCONATE 0.12 % MT SOLN
OROMUCOSAL | Status: AC
  Administered 2022-04-29: 15 mL via OROMUCOSAL
  Filled 2022-04-29: qty 15

## 2022-04-29 MED ORDER — DEXAMETHASONE SODIUM PHOSPHATE 10 MG/ML IJ SOLN
INTRAMUSCULAR | Status: DC | PRN
  Administered 2022-04-29: 5 mg via INTRAVENOUS

## 2022-04-29 MED ORDER — HYDROCHLOROTHIAZIDE 12.5 MG PO TABS
12.5000 mg | ORAL_TABLET | Freq: Every day | ORAL | Status: DC
  Administered 2022-04-30: 12.5 mg via ORAL
  Filled 2022-04-29: qty 1

## 2022-04-29 MED ORDER — KETAMINE HCL 50 MG/5ML IJ SOSY
PREFILLED_SYRINGE | INTRAMUSCULAR | Status: AC
  Filled 2022-04-29: qty 5

## 2022-04-29 MED ORDER — ONDANSETRON HCL 4 MG/2ML IJ SOLN
4.0000 mg | Freq: Once | INTRAMUSCULAR | Status: AC
  Administered 2022-04-29: 4 mg via INTRAVENOUS
  Filled 2022-04-29: qty 2

## 2022-04-29 MED ORDER — ONDANSETRON 4 MG PO TBDP: 4.0000 mg | ORAL_TABLET | Freq: Four times a day (QID) | ORAL | Status: DC | PRN

## 2022-04-29 MED ORDER — ONDANSETRON HCL 4 MG/2ML IJ SOLN: 4.0000 mg | Freq: Four times a day (QID) | INTRAMUSCULAR | Status: DC | PRN

## 2022-04-29 MED ORDER — ORAL CARE MOUTH RINSE
15.0000 mL | Freq: Once | OROMUCOSAL | Status: AC
  Filled 2022-04-29: qty 15

## 2022-04-29 MED ORDER — SODIUM CHLORIDE FLUSH 0.9 % IV SOLN
INTRAVENOUS | Status: AC
  Filled 2022-04-29: qty 10

## 2022-04-29 MED ORDER — ESMOLOL HCL 100 MG/10ML IV SOLN
INTRAVENOUS | Status: AC
  Filled 2022-04-29: qty 10

## 2022-04-29 MED ORDER — ONDANSETRON HCL 4 MG/2ML IJ SOLN
INTRAMUSCULAR | Status: DC | PRN
  Administered 2022-04-29: 4 mg via INTRAVENOUS

## 2022-04-29 MED ORDER — SUCCINYLCHOLINE CHLORIDE 200 MG/10ML IV SOSY
PREFILLED_SYRINGE | INTRAVENOUS | Status: DC | PRN
  Administered 2022-04-29: 100 mg via INTRAVENOUS

## 2022-04-29 MED ORDER — PHENYLEPHRINE 80 MCG/ML (10ML) SYRINGE FOR IV PUSH (FOR BLOOD PRESSURE SUPPORT)
PREFILLED_SYRINGE | INTRAVENOUS | Status: AC
  Filled 2022-04-29: qty 10

## 2022-04-29 MED ORDER — ESMOLOL HCL 100 MG/10ML IV SOLN
INTRAVENOUS | Status: DC | PRN
  Administered 2022-04-29: 20 mg via INTRAVENOUS

## 2022-04-29 SURGICAL SUPPLY — 54 items
ADH SKN CLS APL DERMABOND .7 (GAUZE/BANDAGES/DRESSINGS) ×1
BAG PRESSURE INF REUSE 1000 (BAG) IMPLANT
CANNULA REDUC XI 12-8 STAPL (CANNULA) ×1
CANNULA REDUCER 12-8 DVNC XI (CANNULA) ×1 IMPLANT
CLIP LIGATING HEMO O LOK GREEN (MISCELLANEOUS) ×1 IMPLANT
CUP MEDICINE 2OZ PLAST GRAD ST (MISCELLANEOUS) ×1 IMPLANT
DERMABOND ADVANCED .7 DNX12 (GAUZE/BANDAGES/DRESSINGS) ×1 IMPLANT
DRAPE ARM DVNC X/XI (DISPOSABLE) ×4 IMPLANT
DRAPE COLUMN DVNC XI (DISPOSABLE) ×1 IMPLANT
DRAPE DA VINCI XI ARM (DISPOSABLE) ×4
DRAPE DA VINCI XI COLUMN (DISPOSABLE) ×1
ELECT CAUTERY BLADE TIP 2.5 (TIP) ×1
ELECT REM PT RETURN 9FT ADLT (ELECTROSURGICAL) ×1
ELECTRODE CAUTERY BLDE TIP 2.5 (TIP) ×1 IMPLANT
ELECTRODE REM PT RTRN 9FT ADLT (ELECTROSURGICAL) ×1 IMPLANT
GLOVE SURG SYN 7.0 (GLOVE) ×3 IMPLANT
GLOVE SURG SYN 7.0 PF PI (GLOVE) ×2 IMPLANT
GLOVE SURG SYN 7.5  E (GLOVE) ×3
GLOVE SURG SYN 7.5 E (GLOVE) ×3 IMPLANT
GLOVE SURG SYN 7.5 PF PI (GLOVE) ×2 IMPLANT
GOWN STRL REUS W/ TWL LRG LVL3 (GOWN DISPOSABLE) ×4 IMPLANT
GOWN STRL REUS W/TWL LRG LVL3 (GOWN DISPOSABLE) ×4
IRRIGATOR SUCT 8 DISP DVNC XI (IRRIGATION / IRRIGATOR) IMPLANT
IRRIGATOR SUCTION 8MM XI DISP (IRRIGATION / IRRIGATOR) ×1
IV NS 1000ML (IV SOLUTION) ×1
IV NS 1000ML BAXH (IV SOLUTION) IMPLANT
KIT PINK PAD W/HEAD ARE REST (MISCELLANEOUS) ×1 IMPLANT
KIT PINK PAD W/HEAD ARM REST (MISCELLANEOUS) ×1 IMPLANT
LABEL OR SOLS (LABEL) ×1 IMPLANT
MANIFOLD NEPTUNE II (INSTRUMENTS) ×1 IMPLANT
NEEDLE HYPO 22GX1.5 SAFETY (NEEDLE) ×1 IMPLANT
NS IRRIG 500ML POUR BTL (IV SOLUTION) ×1 IMPLANT
OBTURATOR OPTICAL STANDARD 8MM (TROCAR) ×1
OBTURATOR OPTICAL STND 8 DVNC (TROCAR) ×1
OBTURATOR OPTICALSTD 8 DVNC (TROCAR) ×1 IMPLANT
PACK LAP CHOLECYSTECTOMY (MISCELLANEOUS) ×1 IMPLANT
SEAL CANN UNIV 5-8 DVNC XI (MISCELLANEOUS) ×3 IMPLANT
SEAL XI 5MM-8MM UNIVERSAL (MISCELLANEOUS) ×3
SET TUBE SMOKE EVAC HIGH FLOW (TUBING) ×1 IMPLANT
SOLUTION ELECTROLUBE (MISCELLANEOUS) ×1 IMPLANT
SPIKE FLUID TRANSFER (MISCELLANEOUS) ×1 IMPLANT
SPONGE T-LAP 18X18 ~~LOC~~+RFID (SPONGE) IMPLANT
SPONGE T-LAP 4X18 ~~LOC~~+RFID (SPONGE) ×1 IMPLANT
STAPLER CANNULA SEAL DVNC XI (STAPLE) ×1 IMPLANT
STAPLER CANNULA SEAL XI (STAPLE) ×1
SUT MNCRL AB 4-0 PS2 18 (SUTURE) ×1 IMPLANT
SUT VIC AB 3-0 SH 27 (SUTURE)
SUT VIC AB 3-0 SH 27X BRD (SUTURE) IMPLANT
SUT VICRYL 0 AB UR-6 (SUTURE) ×2 IMPLANT
SYS BAG RETRIEVAL 10MM (BASKET) ×1
SYSTEM BAG RETRIEVAL 10MM (BASKET) ×1 IMPLANT
TAPE TRANSPORE STRL 2 31045 (GAUZE/BANDAGES/DRESSINGS) ×1 IMPLANT
TRAP FLUID SMOKE EVACUATOR (MISCELLANEOUS) ×1 IMPLANT
WATER STERILE IRR 500ML POUR (IV SOLUTION) ×1 IMPLANT

## 2022-04-29 NOTE — Transfer of Care (Signed)
Immediate Anesthesia Transfer of Care Note  Patient: Ana Randall  Procedure(s) Performed: XI ROBOTIC ASSISTED LAPAROSCOPIC CHOLECYSTECTOMY INDOCYANINE GREEN FLUORESCENCE IMAGING (ICG)  Patient Location: PACU  Anesthesia Type:General  Level of Consciousness: awake, alert  and oriented  Airway & Oxygen Therapy: Patient Spontanous Breathing and Patient connected to face mask oxygen  Post-op Assessment: Report given to RN and Post -op Vital signs reviewed and stable  Post vital signs: Reviewed and stable  Last Vitals:  Vitals Value Taken Time  BP 151/62 04/29/22 1425  Temp    Pulse 99 04/29/22 1427  Resp 16 04/29/22 1427  SpO2 100 % 04/29/22 1427  Vitals shown include unvalidated device data.  Last Pain:  Vitals:   04/29/22 1054  TempSrc: Temporal  PainSc:          Complications: No notable events documented.

## 2022-04-29 NOTE — Anesthesia Procedure Notes (Signed)
Procedure Name: Intubation Date/Time: 04/29/2022 12:39 PM  Performed by: Fredderick Phenix, CRNAPre-anesthesia Checklist: Patient identified, Emergency Drugs available, Suction available and Patient being monitored Patient Re-evaluated:Patient Re-evaluated prior to induction Oxygen Delivery Method: Circle system utilized Preoxygenation: Pre-oxygenation with 100% oxygen Induction Type: IV induction Ventilation: Mask ventilation without difficulty Laryngoscope Size: Glidescope and 3 Tube type: Oral Tube size: 6.0 mm Number of attempts: 1 Airway Equipment and Method: Stylet and Oral airway Placement Confirmation: ETT inserted through vocal cords under direct vision, positive ETCO2 and breath sounds checked- equal and bilateral Secured at: 20 cm Tube secured with: Tape Dental Injury: Teeth and Oropharynx as per pre-operative assessment

## 2022-04-29 NOTE — ED Triage Notes (Signed)
Pt to triage via w/c with no distress noted; st N/V/D since midnight with some abd pain

## 2022-04-29 NOTE — ED Notes (Addendum)
X2 unsuccessful IV attempts by previous RN.  IV successfully placed by this RN, unable to obtain blood. Will attempt straight stick.

## 2022-04-29 NOTE — ED Notes (Addendum)
X1 straight stick attempted by this RN, unsuccessful. Lab called for blood draw.

## 2022-04-29 NOTE — Anesthesia Preprocedure Evaluation (Signed)
Anesthesia Evaluation  Patient identified by MRN, date of birth, ID band Patient awake    Reviewed: Allergy & Precautions, NPO status , Patient's Chart, lab work & pertinent test results  History of Anesthesia Complications Negative for: history of anesthetic complications  Airway Mallampati: III  TM Distance: >3 FB Neck ROM: Full    Dental no notable dental hx. (+) Teeth Intact   Pulmonary neg pulmonary ROS, neg sleep apnea, neg COPD, Patient abstained from smoking.Not current smoker,    Pulmonary exam normal breath sounds clear to auscultation       Cardiovascular Exercise Tolerance: Good METShypertension, Pt. on medications (-) CAD and (-) Past MI (-) dysrhythmias  Rhythm:Regular Rate:Normal - Systolic murmurs    Neuro/Psych  Headaches, PSYCHIATRIC DISORDERS Anxiety Depression    GI/Hepatic neg GERD  ,(+)     (-) substance abuse  ,   Endo/Other  neg diabetesHypothyroidism   Renal/GU CRFRenal disease     Musculoskeletal   Abdominal   Peds  Hematology   Anesthesia Other Findings Past Medical History: No date: Adult hypothyroidism No date: Alopecia No date: Anxiety No date: Arthritis No date: Chronic headaches No date: Controlled insomnia No date: Depression No date: Essential hypertension, benign No date: Hemorrhoids No date: HTN, goal below 150/90 No date: Hypercholesterolemia with hypertriglyceridemia No date: Hypertensive kidney disease with chronic kidney disease  stage III (HCC) No date: Osteoporosis, senile No date: Senile osteoporosis No date: Unspecified hypothyroidism No date: Vitamin D deficiency  Reproductive/Obstetrics                             Anesthesia Physical Anesthesia Plan  ASA: 2  Anesthesia Plan: General   Post-op Pain Management: Ofirmev IV (intra-op)*   Induction: Intravenous  PONV Risk Score and Plan: 4 or greater and Ondansetron,  Dexamethasone and Treatment may vary due to age or medical condition  Airway Management Planned: Oral ETT  Additional Equipment: None  Intra-op Plan:   Post-operative Plan: Extubation in OR  Informed Consent: I have reviewed the patients History and Physical, chart, labs and discussed the procedure including the risks, benefits and alternatives for the proposed anesthesia with the patient or authorized representative who has indicated his/her understanding and acceptance.     Dental advisory given  Plan Discussed with: CRNA and Surgeon  Anesthesia Plan Comments: (Discussed risks of anesthesia with patient and daughter at bedside, including PONV, sore throat, lip/dental/eye damage, post operative cognitive dysfunction. Rare risks discussed as well, such as cardiorespiratory and neurological sequelae, and allergic reactions. Discussed the role of CRNA in patient's perioperative care. Patient understands.)        Anesthesia Quick Evaluation

## 2022-04-29 NOTE — H&P (Signed)
[facility] SURGICAL HISTORY & PHYSICAL (cpt [id number])  HISTORY OF PRESENT ILLNESS (HPI):  88 y.o. female presented to [facility] ED this morning for abdominal pain. Patient reports the acute onset of RUQ abdominal pain last night around midnight. This persisted throughout the night and into this morning. This was sharp in nature. She has only gotten mild relief now with analgesics in the ED. Abdominal pain was accompanied by nausea, emesis, and diarrhea. No fever, chills, SOB, CP, urinary changes, or juandice. She does note that this is her third episode of similar pain in the last 2 weeks or so. The previous episodes had resolved spontaneously. She did follow a low fat diet after her last episode which helped but had recently eaten fatty/fried foods in the last 24 hours. Only previous intra-abdominal surgery is hysterectomy. Work up in the ED revealed a mild leukocytosis to 11.7K but laboratory work up was otherwise reassuring. She did have RUQ [place] which showed cholelithiasis without evidence of cholecystitis.   General surgery is consulted by emergency medicine physician Dr Carrie Mew, MD for evaluation and management of symptomatic cholelithiasis vs early acute cholecystitis.    PAST MEDICAL HISTORY (PMH):  Past Medical History:  Diagnosis Date   Adult hypothyroidism    Alopecia    Anxiety    Arthritis    Chronic headaches    Controlled insomnia    Depression    Essential hypertension, benign    Hemorrhoids    HTN, goal below 150/90    Hypercholesterolemia with hypertriglyceridemia    Hypertensive kidney disease with chronic kidney disease stage III (HCC)    Osteoporosis, senile    Senile osteoporosis    Unspecified hypothyroidism    Vitamin D deficiency     Reviewed. Otherwise negative.   PAST SURGICAL HISTORY [facility]):  Past Surgical History:  Procedure Laterality Date   ABDOMINAL HYSTERECTOMY  1974   vaginal: partial   CATARACT EXTRACTION Right 04/14/2011    CATARACT EXTRACTION Left 10/19/2010    Reviewed. Otherwise negative.   MEDICATIONS:  Prior to Admission medications   Medication Sig Start Date End Date Taking? Authorizing Provider  acetaminophen (TYLENOL) 500 MG tablet Take 1,000 mg by mouth every 6 (six) hours as needed (Headache).    [provider]  ALPRAZolam Duanne Moron) 0.5 MG tablet Take 0.5 mg by mouth at bedtime.  04/02/11   Lafayette Dragon, MD  Calcium Carbonate-Vitamin D (CALTRATE 600+D PO) Take 1 tablet by mouth daily.    [provider]  cetirizine-pseudoephedrine (ZYRTEC-D) 5-120 MG per tablet Take 1 tablet by mouth daily as needed for allergies.    [provider]  Cholecalciferol (VITAMIN D3) 1000 UNITS CAPS Take by mouth.    [provider]  DHA-EPA-VITAMIN E PO Take by mouth.    [provider]  hydrocortisone 2.5 % cream Apply topically 2 (two) times daily. 03/13/15   Bobetta Lime, MD  levothyroxine (SYNTHROID, LEVOTHROID) 50 MCG tablet Take 1 tablet by mouth  daily in the morning on an  empty stomach 03/27/15   Bobetta Lime, MD  losartan-hydrochlorothiazide [facility]) 100-12.5 MG per tablet Take 1 tablet by mouth  daily 03/27/15   Bobetta Lime, MD  Multiple Vitamins-Minerals (MULTIVITAMIN ADULT PO) Take by mouth.    [provider]  Omega-3 Fatty Acids (FISH OIL) 1200 MG CAPS Take 1 capsule by mouth daily.    [provider]     ALLERGIES:  Allergies  Allergen Reactions   Azithromycin Nausea Only   Sulfa  Antibiotics Other (See Comments)   Amoxicillin-Pot Clavulanate Nausea And Vomiting     SOCIAL HISTORY:  Social History   Socioeconomic History   Marital status: Married    Spouse name: Not on file   Number of children: Not on file   Years of education: Not on file   Highest education level: Not on file  Occupational History   Occupation: retired  Tobacco Use   Smoking status: Never   Smokeless tobacco: Never  Vaping Use   Vaping Use: Never  used  Substance and Sexual Activity   Alcohol use: No    Alcohol/week: 0.0 standard drinks of alcohol   Drug use: No   Sexual activity: Not Currently  Other Topics Concern   Not on file  Social History Narrative   Not on file   Social Determinants of Health   Financial Resource Strain: Not on file  Food Insecurity: Not on file  Transportation Needs: Not on file  Physical Activity: Not on file  Stress: Not on file  Social Connections: Not on file  Intimate Partner Violence: Not on file     FAMILY HISTORY:  Family History  Problem Relation Age of Onset   Diabetes Other    Hypertension Mother    Hypertension Father    Diabetes Sister    Breast cancer Daughter 32    Otherwise negative.   REVIEW OF SYSTEMS:  Review of Systems  Constitutional:  Negative for chills and fever.  HENT:  Negative for congestion and sore throat.   Respiratory:  Negative for cough and shortness of breath.   Cardiovascular:  Negative for chest pain and palpitations.  Gastrointestinal:  Positive for abdominal pain, diarrhea, nausea and vomiting. Negative for blood in stool and constipation.  Genitourinary:  Negative for dysuria and urgency.  All other systems reviewed and are negative.   VITAL SIGNS:  Temp:  [97.7 F (36.5 C)-97.8 F (36.6 C)] 97.8 F (36.6 C) (09/21 0641) Pulse Rate:  [70-76] 74 (09/21 0830) Resp:  [17-18] 17 (09/21 0830) BP: (132-180)/(52-69) 145/52 (09/21 0830) SpO2:  [92 %-98 %] 92 % (09/21 0830) Weight:  [60.3 kg] 60.3 kg (09/21 0541)     Height: '4\' 11"'$  (149.9 cm) Weight: 60.3 kg BMI (Calculated): 26.85   PHYSICAL EXAM:  Physical Exam Vitals and nursing note reviewed.  Constitutional:      General: She is not in acute distress.    Appearance: Normal appearance. She is normal weight. She is not ill-appearing.     Comments: Patient resting in bed; NAD; family at bedside  HENT:     Head: Normocephalic and atraumatic.  Eyes:     General: No scleral icterus.     Conjunctiva/sclera: Conjunctivae normal.  Cardiovascular:     Rate and Rhythm: Normal rate.     Pulses: Normal pulses.     Heart sounds: No murmur heard. Pulmonary:     Effort: Pulmonary effort is normal. No respiratory distress.  Abdominal:     General: Abdomen is flat. There is no distension.     Palpations: Abdomen is soft.     Tenderness: There is abdominal tenderness in the right upper quadrant. There is no guarding or rebound. Negative signs include Murphy's sign.     Comments: Abdomen is soft, she does report tenderness in the RUQ, negative Murphy's Sign, non-distended, no rebound/guarding   Genitourinary:    Comments: Deferred Musculoskeletal:     Right lower leg: No edema.     Left lower leg: No  edema.  Skin:    General: Skin is warm and dry.     Coloration: Skin is not jaundiced or pale.     Findings: No erythema.  Neurological:     General: No focal deficit present.     Mental Status: She is alert and oriented to person, place, and time. Mental status is at baseline.     INTAKE/OUTPUT:  This shift: No intake/output data recorded.  Last 2 shifts: '@IOLAST2SHIFTS'$ @  Labs:     Latest Ref Rng & Units 04/29/2022    5:44 AM 04/22/2013   11:16 AM 05/26/2012    9:57 AM  CBC  WBC 4.0 - 10.5 K/uL 11.7  8.0  8.3   Hemoglobin 12.0 - 15.0 g/dL 13.8  13.4  12.6   Hematocrit 36.0 - 46.0 % 42.0  38.7  37.0   Platelets 150 - 400 K/uL 271  284  251       Latest Ref Rng & Units 04/29/2022    8:56 AM 04/22/2013   11:16 AM 05/26/2012    9:57 AM  CMP  Glucose 70 - 99 mg/dL 145  108  112   BUN 8 - 23 mg/dL '17  16  21   '$ Creatinine 0.44 - 1.00 mg/dL 0.98  1.02  1.06   Sodium 135 - 145 mmol/L 136  139  139   Potassium 3.5 - 5.1 mmol/L 4.4  3.8  4.2   Chloride 98 - 111 mmol/L 102  98  102   CO2 22 - 32 mmol/L '27  26  25   '$ Calcium 8.9 - 10.3 mg/dL 9.5  10.7  10.6   Total Protein 6.5 - 8.1 g/dL 6.6     Total Bilirubin 0.3 - 1.2 mg/dL 0.6     Alkaline Phos 38 - 126 U/L 37     AST  15 - 41 U/L 21     ALT 0 - 44 U/L 19       Imaging studies:   RUQ [place] (04/29/2022) personally reviewed with gallstone near neck of the gallbladder, no significant changes concerning for cholecystitis, and radiologist report reviewed below:  IMPRESSION: 1. Cholelithiasis without gallbladder wall thickening or pericholecystic fluid. There appears to be a stone lodged in the neck of the gallbladder. 2. No biliary dilatation.  Assessment/Plan: (ICD-10's: K81.0) 88 y.o. female with RUQ abdominal pain, nausea, and emesis likely secondary to symptomatic cholelithiasis vs early cholecystitis   - Will plan to admit to general surgery - After extensive discussion regarding her disease process and potential options including surgery vs observation vs dietary recommendation, patient elect to proceed with surgery. We will plan for robotic assisted laparoscopic cholecystectomy this afternoon with Dr Hampton Abbot pending OR/Anesthesia availability  - All risks, benefits, and alternatives to above procedure(s) were discussed with the patient and her family, all of their questions were answered to their expressed satisfaction, patient expresses she wishes to proceed, and informed consent was obtained.    - NPO + IVF Resuscitation  - IV Abx (Rocephin) - Monitor abdominal examination - Pain control prn; antiemetics prn   - Morning labs   - DVT prophylaxis; hold for OR  All of the above findings and recommendations were discussed with the patient and her family, and all of their questions were answered to their expressed satisfaction.  -- Edison Simon, PA-C [facility] 04/29/2022, 10:07 AM M-F: 7am - 4pm

## 2022-04-29 NOTE — ED Notes (Signed)
Pt brought to room 15, this RN assumed care.

## 2022-04-29 NOTE — Interval H&P Note (Signed)
History and Physical Interval Note:  04/29/2022 11:23 AM  Ana Randall  has presented today for surgery, with the diagnosis of Acute Cholecystitis.  The various methods of treatment have been discussed with the patient and family. After consideration of risks, benefits and other options for treatment, the patient has consented to  Procedure(s): XI ROBOTIC ASSISTED LAPAROSCOPIC CHOLECYSTECTOMY (N/A) [place] (ICG) (N/A) as a surgical intervention.  The patient's history has been reviewed, patient examined, no change in status, stable for surgery.  I have reviewed the patient's chart and labs.  Questions were answered to the patient's satisfaction.     Marquite Attwood

## 2022-04-29 NOTE — Op Note (Signed)
  Procedure Date:  04/29/2022  Pre-operative Diagnosis:  Acute cholecystitis  Post-operative Diagnosis: Acute cholecystitis  Procedure:  Robotic assisted cholecystectomy with ICG FireFly cholangiogram  Surgeon:  Melvyn Neth, MD  Anesthesia:  General endotracheal  Estimated Blood Loss:  20 ml  Specimens:  gallbladder  Complications:  None  Indications for Procedure:  This is a 88 y.o. female who presents with abdominal pain and workup revealing acute cholecystitis.  The benefits, complications, treatment options, and expected outcomes were discussed with the patient. The risks of bleeding, infection, recurrence of symptoms, failure to resolve symptoms, bile duct damage, bile duct leak, retained common bile duct stone, bowel injury, and need for further procedures were all discussed with the patient and she was willing to proceed.  Description of Procedure: The patient was correctly identified in the preoperative area and brought into the operating room.  The patient was placed supine with VTE prophylaxis in place.  Appropriate time-outs were performed.  Anesthesia was induced and the patient was intubated.  Appropriate antibiotics were infused.  The abdomen was prepped and draped in a sterile fashion. An infraumbilical incision was made. A cutdown technique was used to enter the abdominal cavity without injury, and a 12 mm robotic port was inserted.  Pneumoperitoneum was obtained with appropriate opening pressures.  Three 8-mm ports were placed in the mid abdomen at the level of the umbilicus under direct visualization.  The DaVinci platform was docked, camera targeted, and instruments were placed under direct visualization.  The gallbladder was identified and found to be edematous consistent with cholecystitis.  The fundus was grasped and retracted cephalad.  Adhesions were lysed bluntly and with electrocautery. The infundibulum was grasped and retracted laterally, exposing the  peritoneum overlying the gallbladder.  This was incised with electrocautery and extended on either side of the gallbladder.  FireFly cholangiogram was then obtained, and we were able to clearly identify the stump of cystic duct and common bile duct, consistent with a cystic duct obstruction from her cholecystitis and lodged stone at the distal neck.  The cystic duct was short.  The cystic duct and cystic artery were carefully dissected with combination of cautery and blunt dissection.  Both were clipped twice proximally and once distally, cutting in between.  The gallbladder was taken from the gallbladder fossa in a retrograde fashion with electrocautery. The gallbladder was placed in an Endocatch bag. The liver bed was inspected and any bleeding was controlled with electrocautery. The right upper quadrant was then inspected again revealing intact clips, no bleeding, and no ductal injury.  The area was thoroughly irrigated.  The 8 mm ports were removed under direct visualization and the 12 mm port was removed.  The Endocatch bag was brought out via the umbilical incision. The fascial opening was closed using 0 vicryl suture.  Local anesthetic was infused in all incisions.  The umbilical incision was closed with 3-0 Vicryl and 4-0 Monocryl, and the other port incisions were closed with 4-0 Monocryl.  The wounds were cleaned and sealed with DermaBond.  The patient was emerged from anesthesia and extubated and brought to the recovery room for further management.  The patient tolerated the procedure well and all counts were correct at the end of the case.   Melvyn Neth, MD

## 2022-04-29 NOTE — ED Provider Notes (Addendum)
[facility] Provider Note    Event Date/Time   First MD Initiated Contact with Patient 04/29/22 0701     (approximate)   History   Chief Complaint: Emesis   HPI  Ana Randall is a 88 y.o. female who comes ED complaining of nausea vomiting diarrhea and generalized abdominal cramping since midnight last night.  She attributes this to eating a lunch consisting of fries from [employer] yesterday.  She has been eating and drinking normally throughout the day.  Denies chest pain or shortness of breath.  She does endorse fatigue and night sweats for the past 3 days.  No fever or cough.  No dysuria or dizziness.     Physical Exam   Triage Vital Signs: ED Triage Vitals  Enc Vitals Group     BP 04/29/22 0542 (!) 180/65     Pulse Rate 04/29/22 0542 70     Resp 04/29/22 0542 18     Temp 04/29/22 0542 97.7 F (36.5 C)     Temp Source 04/29/22 0542 Oral     SpO2 04/29/22 0542 98 %     Weight 04/29/22 0541 133 lb (60.3 kg)     Height 04/29/22 0541 '4\' 11"'$  (1.499 m)     Head Circumference --      Peak Flow --      Pain Score 04/29/22 0541 10     Pain Loc --      Pain Edu? --      Excl. in [place]? --     Most recent vital signs: Vitals:   04/29/22 0641 04/29/22 0830  BP: 132/69 (!) 145/52  Pulse: 76 74  Resp: 18 17  Temp: 97.8 F (36.6 C)   SpO2: 96% 92%    General: Awake, no distress.  CV:  Good peripheral perfusion.  Regular rate and rhythm Resp:  Normal effort.  Clear to auscultation bilaterally Abd:  No distention.  Soft with right-sided tenderness worse in the right upper quadrant.  Negative Murphy sign. Other:  No rash, no lower extremity edema.  Dry mucous membranes.   ED Results / Procedures / Treatments   Labs (all labs ordered are listed, but only abnormal results are displayed) Labs Reviewed  CBC WITH DIFFERENTIAL/PLATELET - Abnormal; Notable for the following components:      Result Value   WBC 11.7 (*)    Neutro Abs 9.3 (*)     Abs Immature Granulocytes 0.08 (*)    All other components within normal limits  URINALYSIS, ROUTINE W REFLEX MICROSCOPIC - Abnormal; Notable for the following components:   Color, Urine YELLOW (*)    Hgb urine dipstick TRACE (*)    All other components within normal limits  COMPREHENSIVE METABOLIC PANEL - Abnormal; Notable for the following components:   Glucose, Bld 145 (*)    Alkaline Phosphatase 37 (*)    GFR, Estimated 57 (*)    All other components within normal limits  SARS CORONAVIRUS 2 BY RT PCR  LIPASE, BLOOD     EKG  Interpreted by me Sinus rhythm rate of 71.  Normal axis and intervals.  Poor R wave progression.  Normal ST segments and T waves.  RADIOLOGY Ultrasound right upper quadrant interpreted by me, shows 2 large gallstones 1 impacted into the bladder neck.  No frank inflammatory changes.  CBD is normal.  Radiology report reviewed.   PROCEDURES:  Procedures   MEDICATIONS ORDERED IN ED: Medications  promethazine (PHENERGAN) 12.5 mg in sodium  chloride 0.9 % 50 mL IVPB (12.5 mg Intravenous New Bag/Given 04/29/22 0937)  indocyanine green (IC-GREEN) injection 2.5 mg (has no administration in time range)  ondansetron (ZOFRAN) injection 4 mg (4 mg Intravenous Given 04/29/22 0807)  fentaNYL (SUBLIMAZE) injection 25 mcg (25 mcg Intravenous Given 04/29/22 0807)  sodium chloride 0.9 % bolus 500 mL (500 mLs Intravenous New Bag/Given 04/29/22 0806)     IMPRESSION / MDM / ASSESSMENT AND PLAN / ED COURSE  I reviewed the triage vital signs and the nursing notes.                              Differential diagnosis includes, but is not limited to, viral syndrome, AKI, electrolyte abnormality, dehydration, cholecystitis, choledocholithiasis, pancreatitis  Patient's presentation is most consistent with acute presentation with potential threat to life or bodily function.  Patient presents with symptoms concerning for viral gastroenteritis.  Will check labs, also obtain  ultrasound given upper abdomen pain and tenderness which could be biliary.  We will give IV fluids, Zofran and IV fentanyl for initial hydration and symptom relief.   ----------------------------------------- 10:11 AM on 04/29/2022 ----------------------------------------- Still having persistent pain and nausea.  Patient was given IV Phenergan as well.  Ultrasound shows impacted stone in the gallbladder neck without frank inflammatory changes.  Case discussed with surgery, and plan will be for surgery to admit for operative management.      FINAL CLINICAL IMPRESSION(S) / ED DIAGNOSES   Final diagnoses:  Calculus of gallbladder with acute cholecystitis without obstruction  Intractable abdominal pain     Rx / DC Orders   ED Discharge Orders     None        Note:  This document was prepared using Dragon voice recognition software and may include unintentional dictation errors.   Carrie Mew, MD 04/29/22 [place], MD 04/29/22 [phone]

## 2022-04-30 LAB — COMPREHENSIVE METABOLIC PANEL
ALT: 42 U/L (ref 0–44)
AST: 31 U/L (ref 15–41)
Albumin: 3.2 g/dL — ABNORMAL LOW (ref 3.5–5.0)
Alkaline Phosphatase: 33 U/L — ABNORMAL LOW (ref 38–126)
Anion gap: 5 (ref 5–15)
BUN: 14 mg/dL (ref 8–23)
CO2: 23 mmol/L (ref 22–32)
Calcium: 8.3 mg/dL — ABNORMAL LOW (ref 8.9–10.3)
Chloride: 107 mmol/L (ref 98–111)
Creatinine, Ser: 0.98 mg/dL (ref 0.44–1.00)
GFR, Estimated: 57 mL/min — ABNORMAL LOW (ref >60)
Glucose, Bld: 154 mg/dL — ABNORMAL HIGH (ref 70–99)
Potassium: 4 mmol/L (ref 3.5–5.1)
Sodium: 135 mmol/L (ref 135–145)
Total Bilirubin: 0.5 mg/dL (ref 0.3–1.2)
Total Protein: 5.8 g/dL — ABNORMAL LOW (ref 6.5–8.1)

## 2022-04-30 LAB — CBC
HCT: 33.8 % — ABNORMAL LOW (ref 36.0–46.0)
Hemoglobin: 11.5 g/dL — ABNORMAL LOW (ref 12.0–15.0)
MCH: 29.5 pg (ref 26.0–34.0)
MCHC: 34 g/dL (ref 30.0–36.0)
MCV: 86.7 fL (ref 80.0–100.0)
Platelets: 261 10*3/uL (ref 150–400)
RBC: 3.9 MIL/uL (ref 3.87–5.11)
RDW: 12.9 % (ref 11.5–15.5)
WBC: 13.1 10*3/uL — ABNORMAL HIGH (ref 4.0–10.5)
nRBC: 0 % (ref 0.0–0.2)

## 2022-04-30 MED ORDER — IBUPROFEN 600 MG PO TABS: 600.0000 mg | ORAL_TABLET | Freq: Four times a day (QID) | ORAL | 0 refills | Status: AC | PRN

## 2022-04-30 MED ORDER — ONDANSETRON 4 MG PO TBDP: 4.0000 mg | ORAL_TABLET | Freq: Four times a day (QID) | ORAL | 0 refills | Status: AC | PRN

## 2022-04-30 MED ORDER — CIPROFLOXACIN HCL 500 MG PO TABS: 500.0000 mg | ORAL_TABLET | Freq: Two times a day (BID) | ORAL | 0 refills | Status: DC

## 2022-04-30 MED ORDER — METRONIDAZOLE 500 MG PO TABS: 500.0000 mg | ORAL_TABLET | Freq: Three times a day (TID) | ORAL | 0 refills | Status: DC

## 2022-04-30 MED ORDER — OXYCODONE HCL 5 MG PO TABS: 5.0000 mg | ORAL_TABLET | ORAL | 0 refills | Status: DC | PRN

## 2022-04-30 NOTE — Anesthesia Postprocedure Evaluation (Signed)
Anesthesia Post Note  Patient: Ana Randall  Procedure(s) Performed: XI ROBOTIC ASSISTED LAPAROSCOPIC CHOLECYSTECTOMY INDOCYANINE GREEN FLUORESCENCE IMAGING (ICG)  Patient location during evaluation: PACU Anesthesia Type: General Level of consciousness: awake and awake and alert Pain management: pain level controlled Vital Signs Assessment: post-procedure vital signs reviewed and stable Respiratory status: spontaneous breathing and respiratory function stable Cardiovascular status: stable Anesthetic complications: no   No notable events documented.   Last Vitals:  Vitals:   04/29/22 2121 04/30/22 0420  BP: (!) 161/63 (!) 136/56  Pulse: 76 73  Resp: 20 20  Temp: 36.8 C 37.1 C  SpO2: 92% 96%    Last Pain:  Vitals:   04/30/22 0420  TempSrc: Oral  PainSc:                  VAN STAVEREN,[name]

## 2022-04-30 NOTE — Discharge Instructions (Signed)

## 2022-04-30 NOTE — Progress Notes (Signed)
Ana Randall to be D/C'd Home per MD order.  Discussed prescriptions and follow up appointments with the patient. Prescriptions given to patient, medication list explained in detail. Pt verbalized understanding.  Allergies as of 04/30/2022       Reactions   Azithromycin Nausea Only   Sulfa Antibiotics Other (See Comments)   Amoxicillin-pot Clavulanate Nausea And Vomiting        Medication List     TAKE these medications    acetaminophen 500 MG tablet Commonly known as: TYLENOL Take 1,000 mg by mouth every 6 (six) hours as needed (Headache).   ALPRAZolam 0.5 MG tablet Commonly known as: XANAX Take 0.5 mg by mouth at bedtime.   APPLE CIDER VINEGAR PO Take 1 tablet by mouth daily.   CALTRATE 600+D PO Take 1 tablet by mouth daily.   cetirizine-pseudoephedrine 5-120 MG tablet Commonly known as: ZYRTEC-D Take 1 tablet by mouth daily as needed for allergies.   ciprofloxacin 500 MG tablet Commonly known as: Cipro Take 1 tablet (500 mg total) by mouth 2 (two) times daily for 7 days.   DHA-EPA-VITAMIN E PO Take by mouth.   Fish Oil 1200 MG Caps Take 1 capsule by mouth daily.   hydrocortisone 2.5 % cream Apply topically 2 (two) times daily.   ibuprofen 600 MG tablet Commonly known as: ADVIL Take 1 tablet (600 mg total) by mouth every 6 (six) hours as needed.   levothyroxine 50 MCG tablet Commonly known as: SYNTHROID Take 1 tablet by mouth  daily in the morning on an  empty stomach   losartan-hydrochlorothiazide 100-12.5 MG tablet Commonly known as: HYZAAR Take 1 tablet by mouth  daily   metroNIDAZOLE 500 MG tablet Commonly known as: Flagyl Take 1 tablet (500 mg total) by mouth 3 (three) times daily for 7 days.   MULTIVITAMIN ADULT PO Take by mouth.   ondansetron 4 MG disintegrating tablet Commonly known as: ZOFRAN-ODT Take 1 tablet (4 mg total) by mouth every 6 (six) hours as needed for nausea.   oxyCODONE 5 MG immediate release tablet Commonly known as:  Oxy IR/ROXICODONE Take 1-2 tablets (5-10 mg total) by mouth every 4 (four) hours as needed for moderate pain.   Vitamin D3 25 MCG (1000 UT) Caps Take by mouth.        Vitals:   04/30/22 0420 04/30/22 0837  BP: (!) 136/56 (!) 131/53  Pulse: 73 75  Resp: 20 18  Temp: 98.7 F (37.1 C) 99 F (37.2 C)  SpO2: 96% 90%    Skin clean, dry and intact without evidence of skin break down, no evidence of skin tears noted. IV catheter discontinued intact. Site without signs and symptoms of complications. Dressing and pressure applied. Pt denies pain at this time. No complaints noted.  An After Visit Summary was printed and given to the patient. Patient escorted via [place], and D/C home via private auto.  Ana Randall

## 2022-04-30 NOTE — Discharge Summary (Signed)
[facility] SURGICAL DISCHARGE SUMMARY  Patient ID: KOLBIE CLARKSTON MRN: [id number] DOB/AGE: [birth year] 88 y.o.  Admit date: 04/29/2022 Discharge date: 04/30/2022  Discharge Diagnoses Patient Active Problem List   Diagnosis Date Noted   Calculus of gallbladder with acute cholecystitis without obstruction 04/29/2022    Consultants None  Procedures 04/29/2022:  Robotic assisted laparoscopic cholecystectomy   HPI: 88 y.o. female presented to [facility] ED this morning for abdominal pain. Patient reports the acute onset of RUQ abdominal pain last night around midnight. This persisted throughout the night and into this morning. This was sharp in nature. She has only gotten mild relief now with analgesics in the ED. Abdominal pain was accompanied by nausea, emesis, and diarrhea. No fever, chills, SOB, CP, urinary changes, or juandice. She does note that this is her third episode of similar pain in the last 2 weeks or so. The previous episodes had resolved spontaneously. She did follow a low fat diet after her last episode which helped but had recently eaten fatty/fried foods in the last 24 hours. Only previous intra-abdominal surgery is hysterectomy. Work up in the ED revealed a mild leukocytosis to 11.7K but laboratory work up was otherwise reassuring. She did have RUQ [place] which showed cholelithiasis without evidence of cholecystitis.  Hospital Course: Informed consent was obtained and documented, and patient underwent uneventful robotic assisted laparoscopic cholecystectomy (Dr Hampton Abbot, 04/29/2022).  Post-operatively, patient's pain/symptoms improved/resolved and advancement of patient's diet and ambulation were well-tolerated. The remainder of patient's hospital course was essentially unremarkable, and discharge planning was initiated accordingly with patient safely able to be discharged home with appropriate discharge instructions, antibiotics (Cipro, Flagyl x7 days), pain control, and  outpatient follow-up after all of her questions were answered to her expressed satisfaction.   Discharge Condition: Good   Physical Examination:  Constitutional: Well appearing female; NAD Pulmonary: Normal effort, no respiratory distress Gastrointestinal: Soft, incisional soreness, non-distended, no rebound/guarding Skin: Laparoscopic incisions are CDI with dermabond, no erythema    Allergies as of 04/30/2022       Reactions   Azithromycin Nausea Only   Sulfa Antibiotics Other (See Comments)   Amoxicillin-pot Clavulanate Nausea And Vomiting        Medication List     TAKE these medications    acetaminophen 500 MG tablet Commonly known as: TYLENOL Take 1,000 mg by mouth every 6 (six) hours as needed (Headache).   ALPRAZolam 0.5 MG tablet Commonly known as: XANAX Take 0.5 mg by mouth at bedtime.   APPLE CIDER VINEGAR PO Take 1 tablet by mouth daily.   CALTRATE 600+D PO Take 1 tablet by mouth daily.   cetirizine-pseudoephedrine 5-120 MG tablet Commonly known as: ZYRTEC-D Take 1 tablet by mouth daily as needed for allergies.   ciprofloxacin 500 MG tablet Commonly known as: Cipro Take 1 tablet (500 mg total) by mouth 2 (two) times daily for 7 days.   DHA-EPA-VITAMIN E PO Take by mouth.   Fish Oil 1200 MG Caps Take 1 capsule by mouth daily.   hydrocortisone 2.5 % cream Apply topically 2 (two) times daily.   ibuprofen 600 MG tablet Commonly known as: ADVIL Take 1 tablet (600 mg total) by mouth every 6 (six) hours as needed.   levothyroxine 50 MCG tablet Commonly known as: SYNTHROID Take 1 tablet by mouth  daily in the morning on an  empty stomach   losartan-hydrochlorothiazide 100-12.5 MG tablet Commonly known as: HYZAAR Take 1 tablet by mouth  daily   metroNIDAZOLE 500 MG tablet  Commonly known as: Flagyl Take 1 tablet (500 mg total) by mouth 3 (three) times daily for 7 days.   MULTIVITAMIN ADULT PO Take by mouth.   ondansetron 4 MG  disintegrating tablet Commonly known as: ZOFRAN-ODT Take 1 tablet (4 mg total) by mouth every 6 (six) hours as needed for nausea.   oxyCODONE 5 MG immediate release tablet Commonly known as: Oxy IR/ROXICODONE Take 1-2 tablets (5-10 mg total) by mouth every 4 (four) hours as needed for moderate pain.   Vitamin D3 25 MCG (1000 UT) Caps Take by mouth.          Follow-up Information     Tylene Fantasia, PA-C Follow up in 3 week(s).   Specialty: Physician Assistant Why: s/p laparoscopic cholecystectomy Contact information: [address] [place] [address] [phone]                  Time spent on discharge management including discussion of hospital course, clinical condition, outpatient instructions, prescriptions, and follow up with the patient and members of the medical team: >30 minutes  -- Edison Simon , PA-C [facility]  04/30/2022, 8:45 AM [phone] M-F: 7am - 4pm

## 2022-04-30 NOTE — TOC Initial Note (Signed)
Transition of Care [facility]) - Initial/Assessment Note    Patient Details  Name: Ana Randall MRN: [id number] Date of Birth: 1933-12-11  Transition of Care [facility]) CM/SW Contact:    Beverly Sessions, RN Phone Number: 04/30/2022, 9:48 AM  Clinical Narrative:                   Transition of Care [facility]) Screening Note   Patient Details  Name: Ana Randall Date of Birth: [birth year]   Transition of Care [facility]) CM/SW Contact:    Beverly Sessions, RN Phone Number: 04/30/2022, 9:48 AM    Transition of Care Department [facility]) has reviewed patient and no TOC needs have been identified at this time. We will continue to monitor patient advancement through interdisciplinary progression rounds. If new patient transition needs arise, please place a TOC consult.         Patient Goals and CMS Choice        Expected Discharge Plan and Services           Expected Discharge Date: 04/30/22                                    Prior Living Arrangements/Services                       Activities of Daily Living Home Assistive Devices/Equipment: None ADL Screening (condition at time of admission) Patient's cognitive ability adequate to safely complete daily activities?: Yes Is the patient deaf or have difficulty hearing?: No Does the patient have difficulty seeing, even when wearing glasses/contacts?: No Does the patient have difficulty concentrating, remembering, or making decisions?: No Patient able to express need for assistance with ADLs?: Yes Does the patient have difficulty dressing or bathing?: No Independently performs ADLs?: Yes (appropriate for developmental age) Does the patient have difficulty walking or climbing stairs?: No Weakness of Legs: None Weakness of Arms/Hands: None  Permission Sought/Granted                  Emotional Assessment              Admission diagnosis:  Acute cholecystitis [K81.0] Calculus of gallbladder with acute  cholecystitis without obstruction [K80.00] Intractable abdominal pain [R10.9] Patient Active Problem List   Diagnosis Date Noted   Calculus of gallbladder with acute cholecystitis without obstruction 04/29/2022   Adult hypothyroidism 03/13/2015   Alopecia 03/13/2015   Anxiety 03/13/2015   Cannot sleep 03/13/2015   Hemorrhoid 03/13/2015   History of basal cell cancer 03/13/2015   H/O cataract 03/13/2015   BP (high blood pressure) 03/13/2015   Elevated cholesterol with elevated triglycerides 03/13/2015   Heart & renal disease, hypertensive, with heart failure ([place]) 03/13/2015   Osteoporosis, post-menopausal 03/13/2015   Post menopausal syndrome 03/13/2015   Avitaminosis D 03/13/2015   Pruritic rash 03/13/2015   H/O neoplasm 11/25/2014   PCP:  Idelle Crouch, MD Pharmacy:   [facility] [address], [place] - [place] [address] [place] [address] Phone: [phone] Fax: [place], [place] [address] Phone: [phone] Fax: [phone]  [id number] [place], [place] [address] [id number]: [phone]: [phone] [facility] [address]  [facility] [address] [address] Phone: [phone] Fax: [phone]     Social Determinants of Health (SDOH) Interventions    Readmission Risk Interventions     No data to display

## 2022-05-02 ENCOUNTER — Other Ambulatory Visit: Payer: Self-pay

## 2022-05-02 ENCOUNTER — Emergency Department: Payer: Medicare Other

## 2022-05-02 ENCOUNTER — Observation Stay
Admission: EM | Admit: 2022-05-02 | Discharge: 2022-05-04 | Disposition: A | Discharge status: Home / Self Care | Payer: Medicare Other | Attending: Internal Medicine | Admitting: Internal Medicine

## 2022-05-02 DIAGNOSIS — R748 Abnormal levels of other serum enzymes: Secondary | ICD-10-CM | POA: Diagnosis not present

## 2022-05-02 DIAGNOSIS — K859 Acute pancreatitis without necrosis or infection, unspecified: Secondary | ICD-10-CM | POA: Insufficient documentation

## 2022-05-02 DIAGNOSIS — E876 Hypokalemia: Secondary | ICD-10-CM | POA: Insufficient documentation

## 2022-05-02 DIAGNOSIS — E860 Dehydration: Secondary | ICD-10-CM | POA: Diagnosis not present

## 2022-05-02 DIAGNOSIS — I129 Hypertensive chronic kidney disease with stage 1 through stage 4 chronic kidney disease, or unspecified chronic kidney disease: Secondary | ICD-10-CM | POA: Diagnosis not present

## 2022-05-02 DIAGNOSIS — N1831 Chronic kidney disease, stage 3a: Secondary | ICD-10-CM | POA: Diagnosis present

## 2022-05-02 DIAGNOSIS — Z9049 Acquired absence of other specified parts of digestive tract: Secondary | ICD-10-CM

## 2022-05-02 DIAGNOSIS — D72829 Elevated white blood cell count, unspecified: Secondary | ICD-10-CM | POA: Diagnosis not present

## 2022-05-02 DIAGNOSIS — I10 Essential (primary) hypertension: Secondary | ICD-10-CM | POA: Diagnosis present

## 2022-05-02 DIAGNOSIS — R197 Diarrhea, unspecified: Secondary | ICD-10-CM | POA: Diagnosis present

## 2022-05-02 DIAGNOSIS — Z20822 Contact with and (suspected) exposure to covid-19: Secondary | ICD-10-CM | POA: Insufficient documentation

## 2022-05-02 DIAGNOSIS — Z79899 Other long term (current) drug therapy: Secondary | ICD-10-CM | POA: Diagnosis not present

## 2022-05-02 DIAGNOSIS — E039 Hypothyroidism, unspecified: Secondary | ICD-10-CM | POA: Diagnosis not present

## 2022-05-02 DIAGNOSIS — F419 Anxiety disorder, unspecified: Secondary | ICD-10-CM | POA: Diagnosis present

## 2022-05-02 LAB — CBC WITH DIFFERENTIAL/PLATELET
Abs Immature Granulocytes: 0.06 10*3/uL (ref 0.00–0.07)
Basophils Absolute: 0 10*3/uL (ref 0.0–0.1)
Basophils Relative: 0 %
Eosinophils Absolute: 0.4 10*3/uL (ref 0.0–0.5)
Eosinophils Relative: 4 %
HCT: 37.9 % (ref 36.0–46.0)
Hemoglobin: 12.9 g/dL (ref 12.0–15.0)
Immature Granulocytes: 1 %
Lymphocytes Relative: 17 %
Lymphs Abs: 1.9 10*3/uL (ref 0.7–4.0)
MCH: 29.9 pg (ref 26.0–34.0)
MCHC: 34 g/dL (ref 30.0–36.0)
MCV: 87.7 fL (ref 80.0–100.0)
Monocytes Absolute: 0.9 10*3/uL (ref 0.1–1.0)
Monocytes Relative: 8 %
Neutro Abs: 8.2 10*3/uL — ABNORMAL HIGH (ref 1.7–7.7)
Neutrophils Relative %: 70 %
Platelets: 276 10*3/uL (ref 150–400)
RBC: 4.32 MIL/uL (ref 3.87–5.11)
RDW: 13.2 % (ref 11.5–15.5)
WBC: 11.4 10*3/uL — ABNORMAL HIGH (ref 4.0–10.5)
nRBC: 0 % (ref 0.0–0.2)

## 2022-05-02 LAB — URINALYSIS, ROUTINE W REFLEX MICROSCOPIC
Bilirubin Urine: NEGATIVE
Glucose, UA: NEGATIVE mg/dL
Hgb urine dipstick: NEGATIVE
Ketones, ur: NEGATIVE mg/dL
Leukocytes,Ua: NEGATIVE
Nitrite: NEGATIVE
Protein, ur: NEGATIVE mg/dL
Specific Gravity, Urine: 1.01 (ref 1.005–1.030)
pH: 6 (ref 5.0–8.0)

## 2022-05-02 LAB — GLUCOSE, CAPILLARY: Glucose-Capillary: 121 mg/dL — ABNORMAL HIGH (ref 70–99)

## 2022-05-02 LAB — COMPREHENSIVE METABOLIC PANEL
ALT: 30 U/L (ref 0–44)
AST: 21 U/L (ref 15–41)
Albumin: 3.6 g/dL (ref 3.5–5.0)
Alkaline Phosphatase: 36 U/L — ABNORMAL LOW (ref 38–126)
Anion gap: 8 (ref 5–15)
BUN: 18 mg/dL (ref 8–23)
CO2: 23 mmol/L (ref 22–32)
Calcium: 8.5 mg/dL — ABNORMAL LOW (ref 8.9–10.3)
Chloride: 105 mmol/L (ref 98–111)
Creatinine, Ser: 1.27 mg/dL — ABNORMAL HIGH (ref 0.44–1.00)
GFR, Estimated: 41 mL/min — ABNORMAL LOW (ref >60)
Glucose, Bld: 126 mg/dL — ABNORMAL HIGH (ref 70–99)
Potassium: 3.6 mmol/L (ref 3.5–5.1)
Sodium: 136 mmol/L (ref 135–145)
Total Bilirubin: 0.7 mg/dL (ref 0.3–1.2)
Total Protein: 6.4 g/dL — ABNORMAL LOW (ref 6.5–8.1)

## 2022-05-02 LAB — LIPASE, BLOOD: Lipase: 362 U/L — ABNORMAL HIGH (ref 11–51)

## 2022-05-02 LAB — SARS CORONAVIRUS 2 BY RT PCR: SARS Coronavirus 2 by RT PCR: NEGATIVE

## 2022-05-02 LAB — TRIGLYCERIDES: Triglycerides: 98 mg/dL (ref <150)

## 2022-05-02 LAB — LACTIC ACID, PLASMA: Lactic Acid, Venous: 1 mmol/L (ref 0.5–1.9)

## 2022-05-02 MED ORDER — OXYCODONE HCL 5 MG PO TABS: 5.0000 mg | ORAL_TABLET | Freq: Four times a day (QID) | ORAL | Status: DC | PRN

## 2022-05-02 MED ORDER — LACTATED RINGERS IV BOLUS
1000.0000 mL | Freq: Once | INTRAVENOUS | Status: AC
  Administered 2022-05-02: 1000 mL via INTRAVENOUS

## 2022-05-02 MED ORDER — ONDANSETRON HCL 4 MG/2ML IJ SOLN
4.0000 mg | Freq: Three times a day (TID) | INTRAMUSCULAR | Status: DC | PRN
  Administered 2022-05-02: 4 mg via INTRAVENOUS
  Filled 2022-05-02: qty 2

## 2022-05-02 MED ORDER — METOCLOPRAMIDE HCL 5 MG/ML IJ SOLN
5.0000 mg | Freq: Three times a day (TID) | INTRAMUSCULAR | Status: DC | PRN
  Administered 2022-05-02: 5 mg via INTRAVENOUS
  Filled 2022-05-02: qty 2

## 2022-05-02 MED ORDER — DICYCLOMINE HCL 10 MG PO CAPS
10.0000 mg | ORAL_CAPSULE | Freq: Once | ORAL | Status: AC
  Administered 2022-05-02: 10 mg via ORAL
  Filled 2022-05-02: qty 1

## 2022-05-02 MED ORDER — SODIUM CHLORIDE 0.9 % IV SOLN
12.5000 mg | Freq: Three times a day (TID) | INTRAVENOUS | Status: DC | PRN
  Administered 2022-05-02: 12.5 mg via INTRAVENOUS
  Filled 2022-05-02: qty 12.5

## 2022-05-02 MED ORDER — ALBUMIN HUMAN 25 % IV SOLN
25.0000 g | Freq: Once | INTRAVENOUS | Status: AC
  Administered 2022-05-02: 25 g via INTRAVENOUS
  Filled 2022-05-02: qty 100

## 2022-05-02 MED ORDER — HYDRALAZINE HCL 20 MG/ML IJ SOLN
5.0000 mg | INTRAMUSCULAR | Status: DC | PRN
  Administered 2022-05-02 – 2022-05-04 (×2): 5 mg via INTRAVENOUS
  Filled 2022-05-02 (×2): qty 1

## 2022-05-02 MED ORDER — ENOXAPARIN SODIUM 30 MG/0.3ML IJ SOSY
30.0000 mg | PREFILLED_SYRINGE | INTRAMUSCULAR | Status: DC
  Administered 2022-05-03: 30 mg via SUBCUTANEOUS
  Filled 2022-05-02: qty 0.3

## 2022-05-02 MED ORDER — LEVOTHYROXINE SODIUM 50 MCG PO TABS
50.0000 ug | ORAL_TABLET | Freq: Every day | ORAL | Status: DC
  Administered 2022-05-03 – 2022-05-04 (×2): 50 ug via ORAL
  Filled 2022-05-02 (×2): qty 1

## 2022-05-02 MED ORDER — ONDANSETRON HCL 4 MG/2ML IJ SOLN
4.0000 mg | Freq: Once | INTRAMUSCULAR | Status: AC
  Administered 2022-05-02: 4 mg via INTRAVENOUS
  Filled 2022-05-02: qty 2

## 2022-05-02 MED ORDER — ENOXAPARIN SODIUM 40 MG/0.4ML IJ SOSY: 40.0000 mg | PREFILLED_SYRINGE | INTRAMUSCULAR | Status: DC

## 2022-05-02 MED ORDER — IOHEXOL 300 MG/ML  SOLN
100.0000 mL | Freq: Once | INTRAMUSCULAR | Status: AC | PRN
  Administered 2022-05-02: 100 mL via INTRAVENOUS

## 2022-05-02 MED ORDER — OMEGA-3-ACID ETHYL ESTERS 1 G PO CAPS
1.0000 g | ORAL_CAPSULE | Freq: Every day | ORAL | Status: DC
  Administered 2022-05-03 – 2022-05-04 (×2): 1 g via ORAL
  Filled 2022-05-02 (×2): qty 1

## 2022-05-02 MED ORDER — VITAMIN D 25 MCG (1000 UNIT) PO TABS
1000.0000 [IU] | ORAL_TABLET | Freq: Every day | ORAL | Status: DC
  Administered 2022-05-03 – 2022-05-04 (×2): 1000 [IU] via ORAL
  Filled 2022-05-02 (×2): qty 1

## 2022-05-02 MED ORDER — ADULT MULTIVITAMIN W/MINERALS CH
1.0000 | ORAL_TABLET | Freq: Every day | ORAL | Status: DC
  Administered 2022-05-03: 1 via ORAL
  Filled 2022-05-02: qty 1

## 2022-05-02 MED ORDER — SODIUM CHLORIDE 0.9 % IV BOLUS
500.0000 mL | Freq: Once | INTRAVENOUS | Status: AC
  Administered 2022-05-02: 500 mL via INTRAVENOUS

## 2022-05-02 MED ORDER — ACETAMINOPHEN 325 MG PO TABS: 650.0000 mg | ORAL_TABLET | Freq: Four times a day (QID) | ORAL | Status: DC | PRN

## 2022-05-02 MED ORDER — ALPRAZOLAM 0.5 MG PO TABS
0.5000 mg | ORAL_TABLET | Freq: Three times a day (TID) | ORAL | Status: DC | PRN
  Filled 2022-05-02: qty 1

## 2022-05-02 MED ORDER — SODIUM CHLORIDE 0.9 % IV SOLN: INTRAVENOUS | Status: DC

## 2022-05-02 NOTE — ED Triage Notes (Signed)
Pt arrives via [facility] from home with CC of nausea and diarrhea that began yesterday afternoon - associated weakness. Pt reports having gallbladder surgery on 04/29/2022.

## 2022-05-02 NOTE — Assessment & Plan Note (Addendum)
-  Continue home Xanax ?

## 2022-05-02 NOTE — ED Provider Notes (Signed)
[facility] Provider Note    Event Date/Time   First MD Initiated Contact with Patient 05/02/22 [phone]     (approximate)   History   Weakness   HPI  Ana Randall is a 88 y.o. female  here with abd pain. Pt is s/p cholecystectomy 9/21 with Dr. Hampton Abbot.  She had been recovering well.  However, she states she took her first dose of antibiotic at home yesterday and since then has had profuse, watery, diarrhea.  She has had mild abdominal cramping but this is only with diarrhea.  She otherwise has been well.  No fevers or chills.  She had some nausea and poor appetite. No urinary symptoms.      Physical Exam   Triage Vital Signs: ED Triage Vitals  Enc Vitals Group     BP 05/02/22 0653 (!) 150/54     Pulse Rate 05/02/22 0653 73     Resp 05/02/22 0653 15     Temp 05/02/22 0653 98.7 F (37.1 C)     Temp Source 05/02/22 0653 Oral     SpO2 05/02/22 0653 93 %     Weight 05/02/22 0654 133 lb (60.3 kg)     Height 05/02/22 0654 '4\' 11"'$  (1.499 m)     Head Circumference --      Peak Flow --      Pain Score 05/02/22 0654 0     Pain Loc --      Pain Edu? --      Excl. in [place]? --     Most recent vital signs: Vitals:   05/02/22 0700 05/02/22 0800  BP: (!) 176/80 (!) 170/58  Pulse: 72 72  Resp: (!) 31 17  Temp:    SpO2: 92% 96%     General: Awake, no distress.  CV:  Good peripheral perfusion.  Resp:  Normal effort.  Abd:  No distention.  No significant tenderness.  Operative sites appear clean, dry, intact with very minimal ecchymoses.  No distention. Other:  Mild dry mucous membranes.   ED Results / Procedures / Treatments   Labs (all labs ordered are listed, but only abnormal results are displayed) Labs Reviewed  CBC WITH DIFFERENTIAL/PLATELET - Abnormal; Notable for the following components:      Result Value   WBC 11.4 (*)    Neutro Abs 8.2 (*)    All other components within normal limits  COMPREHENSIVE METABOLIC PANEL - Abnormal; Notable  for the following components:   Glucose, Bld 126 (*)    Creatinine, Ser 1.27 (*)    Calcium 8.5 (*)    Total Protein 6.4 (*)    Alkaline Phosphatase 36 (*)    GFR, Estimated 41 (*)    All other components within normal limits  LIPASE, BLOOD - Abnormal; Notable for the following components:   Lipase 362 (*)    All other components within normal limits  SARS CORONAVIRUS 2 BY RT PCR  C DIFFICILE QUICK SCREEN W PCR REFLEX    GASTROINTESTINAL PANEL BY PCR, STOOL (REPLACES STOOL CULTURE)  LACTIC ACID, PLASMA  LACTIC ACID, PLASMA  URINALYSIS, ROUTINE W REFLEX MICROSCOPIC     EKG    RADIOLOGY CT abdomen/pelvis: Status post cholecystectomy with no unexpected postop complications, otherwise unremarkable   I also independently reviewed and agree with radiologist interpretations.   PROCEDURES:  Critical Care performed: No    MEDICATIONS ORDERED IN ED: Medications  lactated ringers bolus 1,000 mL (1,000 mLs Intravenous New Bag/Given 05/02/22  7948)  ondansetron [facility]) injection 4 mg (4 mg Intravenous Given 05/02/22 0727)  dicyclomine (BENTYL) capsule 10 mg (10 mg Oral Given 05/02/22 0727)  iohexol (OMNIPAQUE) 300 MG/ML solution 100 mL (100 mLs Intravenous Contrast Given 05/02/22 0734)     IMPRESSION / MDM / ASSESSMENT AND PLAN / ED COURSE  I reviewed the triage vital signs and the nursing notes.                              Ddx:  Differential includes the following, with pertinent life- or limb-threatening emergencies considered:  Antibiotic associated diarrhea, C. difficile, colitis, pancreatitis, postop diarrhea in the setting of recent cholecystectomy, medication adverse effect  Patient's presentation is most consistent with acute presentation with potential threat to life or bodily function.  MDM:  88 year old female status post cholecystectomy 9/21 here with diarrhea and generalized weakness with nausea.  Her abdomen is overall soft without signs to suggest  peritonitis.  Vital signs are stable.  Lab work reviewed, interestingly shows significant lipase elevation.  This was normal prior to her cholecystectomy.  Lactic acid is normal.  CMP shows slight dehydration, but normal LFTs and bilirubin.  Mild leukocytosis is nonspecific and down from her last value 9/22.  COVID-negative.  CT scan obtained, reviewed, shows no acute postop complications.  We will plan to admit to medicine for IV hydration, management of her pancreatitis, as well as further work-up with hopefully C. difficile and GI panel when she is able to provide a stool sample.  Dr. Perrin Maltese notified and can see pt as inpatient as well though surgically no apparent issues on CT or history.   MEDICATIONS GIVEN IN ED: Medications  lactated ringers bolus 1,000 mL (1,000 mLs Intravenous New Bag/Given 05/02/22 0713)  ondansetron (ZOFRAN) injection 4 mg (4 mg Intravenous Given 05/02/22 0727)  dicyclomine (BENTYL) capsule 10 mg (10 mg Oral Given 05/02/22 0727)  iohexol (OMNIPAQUE) 300 MG/ML solution 100 mL (100 mLs Intravenous Contrast Given 05/02/22 0734)     Consults:  Dr. Dahlia Byes general surgery Hospitalist   EMR reviewed  Prior admission and op note with Dr. Hampton Abbot     FINAL CLINICAL IMPRESSION(S) / ED DIAGNOSES   Final diagnoses:  Acute pancreatitis, unspecified complication status, unspecified pancreatitis type  Dehydration     Rx / DC Orders   ED Discharge Orders     None        Note:  This document was prepared using Dragon voice recognition software and may include unintentional dictation errors.   Duffy Bruce, MD 05/02/22 [phone]

## 2022-05-02 NOTE — Assessment & Plan Note (Signed)
Lipase is 362.  No abdominal pain.  CT scan did not show any signs of pancreatitis.  May be related lipase elevation may be related to recent cholecystitis and cholecystectomy -Check triglyceride level -Repeat lipase in the morning

## 2022-05-02 NOTE — Assessment & Plan Note (Addendum)
Continue Synthroid °

## 2022-05-02 NOTE — Progress Notes (Signed)
PHARMACIST - PHYSICIAN COMMUNICATION  CONCERNING:  Enoxaparin (Lovenox) for DVT Prophylaxis    RECOMMENDATION: Patient was prescribed enoxaprin '40mg'$  q24 hours for VTE prophylaxis.   Filed Weights   05/02/22 0654  Weight: 60.3 kg (133 lb)    Body mass index is 26.86 kg/m.  Estimated Creatinine Clearance: 25.6 mL/min (A) (by C-G formula based on SCr of 1.27 mg/dL (H)).   Patient is candidate for enoxaparin '30mg'$  every 24 hours based on CrCl <22m/min   DESCRIPTION: [facility]  Patient is now receiving enoxaparin 30 mg every 24 hours    RDallie Piles PharmD Clinical Pharmacist  05/02/2022 9:14 AM

## 2022-05-02 NOTE — Assessment & Plan Note (Signed)
Patient has mild leukocytosis with WBC 11.4, no fever. -Hold off antibiotics as appropriate Dr. Corlis Leak Recommendation

## 2022-05-02 NOTE — Assessment & Plan Note (Addendum)
Slightly worsening than baseline.  Recent baseline creatinine 0.98 on 04/30/2022.  Her creatinine is 1.27, BUN 18.  GFR 41.  Likely due to dehydration and continuation of Hyzaar -Hold Hyzaar -hold ibuprofen -IV fluid as above

## 2022-05-02 NOTE — Assessment & Plan Note (Signed)
Etiology is not clear.  Not sure if this is may be due to antibiotics use.  Not sure if this is related to recent cholecystectomy.  Consulted Dr. Dahlia Byes of general surgery.  He recommended to hold off antibiotics now.    -Placed on MedSurg bed for observation -IV fluid: 1 L LR, then 75 cc/h of normal saline -Follow-up C. difficile and GI pathogen panel

## 2022-05-02 NOTE — ED Notes (Signed)
Pt taken to CT.

## 2022-05-02 NOTE — ED Notes (Signed)
Pt up to toilet at this time. ?

## 2022-05-02 NOTE — H&P (Signed)
History and Physical    DALAYNA LAUTER MVE:[id number] DOB: May 14, 1934 DOA: 05/02/2022  Referring MD/NP/PA:   PCP: Idelle Crouch, MD   Patient coming from:  The patient is coming from home.  At baseline, pt is independent for most of ADL.        Chief Complaint: Diarrhea  HPI: KYSA CALAIS is a 88 y.o. female with medical history significant of hypertension, hyperlipidemia, hypothyroidism, depression with anxiety, CKD stage IIIa, s/p of right cholecystectomy, who presents with diarrhea.  Patient was recently hospitalized from 9/21 - 9/22 due to gallstone cholecystitis.  Patient is s/p of robotic assisted laparoscopic cholecystectomy.  Patient was discharged on Cipro and Flagyl on 9/22 for 1 week.  Patient states that after she her first dose of antibiotics at home yesterday, began to have multiple episodes of profuse, watery, diarrhea.  She has nausea, no vomiting.  Denies abdominal pain.  No fever or chills.  Denies chest pain, cough, shortness breath.  No symptoms of UTI.  Data reviewed independently and ED Course: pt was found to have lipase 362, liver function normal, WBC 11.4, lactic acid 1.0, negative COVID PCR, slightly worsening renal function, temperature normal, blood pressure 170/58, heart rate 72, RR 31, 17.  Patient is placed on MedSurg bed for position.  Dr. Dahlia Byes of surgery is consulted.   EKG:  Not done in ED, will get one.     CT-abd/pelvis: 1. Status post cholecystectomy. No unexpected postoperative fluid collections or other acute findings noted on today's examination to account for the patient's symptoms. Trace bilateral pleural effusions lying dependently with areas of passive subsegmental atelectasis in the visualized lung bases. 2. Aortic atherosclerosis. 3. Additional incidental findings, as above.  4. Pancreas: No pancreatic mass. No pancreatic ductal dilatation. No pancreatic or peripancreatic fluid collections or inflammatory changes.   Review of  Systems:   General: no fevers, chills, no body weight gain, has poor appetite, has fatigue HEENT: no blurry vision, hearing changes or sore throat Respiratory: no dyspnea, coughing, wheezing CV: no chest pain, no palpitations GI: has nausea, vomiting, abdominal pain, has diarrhea, no constipation GU: no dysuria, burning on urination, increased urinary frequency, hematuria  Ext: no leg edema Neuro: no unilateral weakness, numbness, or tingling, no vision change or hearing loss Skin: no rash, no skin tear. MSK: No muscle spasm, no deformity, no limitation of range of movement in spin Heme: No easy bruising.  Travel history: No recent long distant travel.   Allergy:  Allergies  Allergen Reactions   Cipro [Ciprofloxacin Hcl] Nausea And Vomiting   Azithromycin Nausea Only   Sulfa Antibiotics Other (See Comments)   Amoxicillin-Pot Clavulanate Nausea And Vomiting    Past Medical History:  Diagnosis Date   Adult hypothyroidism    Alopecia    Anxiety    Arthritis    Chronic headaches    Controlled insomnia    Depression    Essential hypertension, benign    Hemorrhoids    HTN, goal below 150/90    Hypercholesterolemia with hypertriglyceridemia    Hypertensive kidney disease with chronic kidney disease stage III (HCC)    Osteoporosis, senile    Senile osteoporosis    Unspecified hypothyroidism    Vitamin D deficiency     Past Surgical History:  Procedure Laterality Date   ABDOMINAL HYSTERECTOMY  1974   vaginal: partial   CATARACT EXTRACTION Right 04/14/2011   CATARACT EXTRACTION Left 10/19/2010    Social History:  reports that she has never smoked.  She has never used smokeless tobacco. She reports that she does not drink alcohol and does not use drugs.  Family History:  Family History  Problem Relation Age of Onset   Diabetes Other    Hypertension Mother    Hypertension Father    Diabetes Sister    Breast cancer Daughter 32     Prior to Admission medications    Medication Sig Start Date End Date Taking? Authorizing Provider  acetaminophen (TYLENOL) 500 MG tablet Take 1,000 mg by mouth every 6 (six) hours as needed (Headache).    [provider]  ALPRAZolam Duanne Moron) 0.5 MG tablet Take 0.5 mg by mouth at bedtime.  04/02/11   Lafayette Dragon, MD  APPLE CIDER VINEGAR PO Take 1 tablet by mouth daily.    [provider]  Calcium Carbonate-Vitamin D (CALTRATE 600+D PO) Take 1 tablet by mouth daily.    [provider]  cetirizine-pseudoephedrine (ZYRTEC-D) 5-120 MG per tablet Take 1 tablet by mouth daily as needed for allergies.    [provider]  Cholecalciferol (VITAMIN D3) 1000 UNITS CAPS Take by mouth.    [provider]  ciprofloxacin (CIPRO) 500 MG tablet Take 1 tablet (500 mg total) by mouth 2 (two) times daily for 7 days. 04/30/22 05/07/22  Tylene Fantasia, PA-C  DHA-EPA-VITAMIN E PO Take by mouth.    [provider]  hydrocortisone 2.5 % cream Apply topically 2 (two) times daily. Patient not taking: Reported on 04/29/2022 03/13/15   Bobetta Lime, MD  ibuprofen (ADVIL) 600 MG tablet Take 1 tablet (600 mg total) by mouth every 6 (six) hours as needed. 04/30/22   Tylene Fantasia, PA-C  levothyroxine (SYNTHROID, LEVOTHROID) 50 MCG tablet Take 1 tablet by mouth  daily in the morning on an  empty stomach 03/27/15   Bobetta Lime, MD  losartan-hydrochlorothiazide [facility]) 100-12.5 MG per tablet Take 1 tablet by mouth  daily 03/27/15   Bobetta Lime, MD  metroNIDAZOLE (FLAGYL) 500 MG tablet Take 1 tablet (500 mg total) by mouth 3 (three) times daily for 7 days. 04/30/22 05/07/22  Tylene Fantasia, PA-C  Multiple Vitamins-Minerals (MULTIVITAMIN ADULT PO) Take by mouth.    [provider]  Omega-3 Fatty Acids (FISH OIL) 1200 MG CAPS Take 1 capsule by mouth daily.    [provider]  ondansetron (ZOFRAN-ODT) 4 MG disintegrating tablet Take 1 tablet (4 mg total) by mouth every 6 (six) hours  as needed for nausea. 04/30/22   Tylene Fantasia, PA-C  oxyCODONE (OXY IR/ROXICODONE) 5 MG immediate release tablet Take 1-2 tablets (5-10 mg total) by mouth every 4 (four) hours as needed for moderate pain. 04/30/22   Tylene Fantasia, PA-C    Physical Exam: Vitals:   05/02/22 1000 05/02/22 1058 05/02/22 1231 05/02/22 1516  BP: (!) 177/63 (!) 168/66 (!) 157/55 (!) 147/60  Pulse: 73 80 86 88  Resp: '18 17  14  '$ Temp: 99.6 F (37.6 C) 99.1 F (37.3 C)  99 F (37.2 C)  TempSrc:  Oral  Oral  SpO2: 93% 93% 93% 92%  Weight:      Height:       General: Not in acute distress HEENT:       Eyes: PERRL, EOMI, no scleral icterus.       ENT: No discharge from the ears and nose, no pharynx injection, no tonsillar enlargement.        Neck: No JVD, no bruit, no mass felt. Heme: No neck lymph  node enlargement. Cardiac: S1/S2, RRR, No murmurs, No gallops or rubs. Respiratory: No rales, wheezing, rhonchi or rubs. GI: Soft, nondistended, nontender, no rebound pain, no organomegaly, BS present. GU: No hematuria Ext: No pitting leg edema bilaterally. 1+DP/PT pulse bilaterally. Musculoskeletal: No joint deformities, No joint redness or warmth, no limitation of ROM in spin. Skin: No rashes.  Neuro: Alert, oriented X3, cranial nerves II-XII grossly intact, moves all extremities normally.  Psych: Patient is not psychotic, no suicidal or hemocidal ideation.  Labs on Admission: I have personally reviewed following labs and imaging studies  CBC: Recent Labs  Lab 04/29/22 0544 04/30/22 0558 05/02/22 0713  WBC 11.7* 13.1* 11.4*  NEUTROABS 9.3*  --  8.2*  HGB 13.8 11.5* 12.9  HCT 42.0 33.8* 37.9  MCV 91.1 86.7 87.7  PLT 271 261 716   Basic Metabolic Panel: Recent Labs  Lab 04/29/22 0856 04/30/22 0558 05/02/22 0713  NA 136 135 136  K 4.4 4.0 3.6  CL 102 107 105  CO2 '27 23 23  '$ GLUCOSE 145* 154* 126*  BUN '17 14 18  '$ CREATININE 0.98 0.98 1.27*  CALCIUM 9.5 8.3* 8.5*   GFR: Estimated  Creatinine Clearance: 25.6 mL/min (A) (by C-G formula based on SCr of 1.27 mg/dL (H)). Liver Function Tests: Recent Labs  Lab 04/29/22 0856 04/30/22 0558 05/02/22 0713  AST '21 31 21  '$ ALT 19 42 30  ALKPHOS 37* 33* 36*  BILITOT 0.6 0.5 0.7  PROT 6.6 5.8* 6.4*  ALBUMIN 3.9 3.2* 3.6   Recent Labs  Lab 04/29/22 0856 05/02/22 0713  LIPASE 44 362*   No results for input(s): "AMMONIA" in the last 168 hours. Coagulation Profile: No results for input(s): "INR", "PROTIME" in the last 168 hours. Cardiac Enzymes: No results for input(s): "CKTOTAL", "CKMB", "CKMBINDEX", "TROPONINI" in the last 168 hours. BNP (last 3 results) No results for input(s): "PROBNP" in the last 8760 hours. HbA1C: No results for input(s): "HGBA1C" in the last 72 hours. CBG: Recent Labs  Lab 05/02/22 1053  GLUCAP 121*   Lipid Profile: Recent Labs    05/02/22 0713  TRIG 98   Thyroid Function Tests: No results for input(s): "TSH", "T4TOTAL", "FREET4", "T3FREE", "THYROIDAB" in the last 72 hours. Anemia Panel: No results for input(s): "VITAMINB12", "FOLATE", "FERRITIN", "TIBC", "IRON", "RETICCTPCT" in the last 72 hours. Urine analysis:    Component Value Date/Time   COLORURINE YELLOW 05/02/2022 0900   APPEARANCEUR CLEAR 05/02/2022 0900   LABSPEC 1.010 05/02/2022 0900   PHURINE 6.0 05/02/2022 0900   GLUCOSEU NEGATIVE 05/02/2022 0900   HGBUR NEGATIVE 05/02/2022 0900   BILIRUBINUR NEGATIVE 05/02/2022 0900   BILIRUBINUR neg 04/11/2015 1014   KETONESUR NEGATIVE 05/02/2022 0900   PROTEINUR NEGATIVE 05/02/2022 0900   UROBILINOGEN 0.2 04/11/2015 1014   NITRITE NEGATIVE 05/02/2022 0900   LEUKOCYTESUR NEGATIVE 05/02/2022 0900   Sepsis Labs: '@LABRCNTIP'$ (procalcitonin:4,lacticidven:4) ) Recent Results (from the past 240 hour(s))  SARS Coronavirus 2 by RT PCR (hospital order, performed in [place] hospital lab) *cepheid single result test* Anterior Nasal Swab     Status: None   Collection Time: 04/29/22   7:44 AM   Specimen: Anterior Nasal Swab  Result Value Ref Range Status   SARS Coronavirus 2 by RT PCR NEGATIVE NEGATIVE Final    Comment: (NOTE) SARS-CoV-2 target nucleic acids are NOT DETECTED.  The SARS-CoV-2 RNA is generally detectable in upper and lower respiratory specimens during the acute phase of infection. The lowest concentration of SARS-CoV-2 viral copies this assay can detect is  250 copies / mL. A negative result does not preclude SARS-CoV-2 infection and should not be used as the sole basis for treatment or other patient management decisions.  A negative result may occur with improper specimen collection / handling, submission of specimen other than nasopharyngeal swab, presence of viral mutation(s) within the areas targeted by this assay, and inadequate number of viral copies (<250 copies / mL). A negative result must be combined with clinical observations, patient history, and epidemiological information.  Fact Sheet for Patients:   https://www.patel.info/  Fact Sheet for Healthcare Providers: https://hall.com/  This test is not yet approved or  cleared by the [place] FDA and has been authorized for detection and/or diagnosis of SARS-CoV-2 by FDA under an Emergency Use Authorization (EUA).  This EUA will remain in effect (meaning this test can be used) for the duration of the COVID-19 declaration under Section 564(b)(1) of the Act, 21 U.S.C. section 360bbb-3(b)(1), unless the authorization is terminated or revoked sooner.  Performed at [facility], [address] [place], [place] [address]   SARS Coronavirus 2 by RT PCR (hospital order, performed in [facility] hospital lab) *cepheid single result test* Anterior Nasal Swab     Status: None   Collection Time: 05/02/22  7:14 AM   Specimen: Anterior Nasal Swab  Result Value Ref Range Status   SARS Coronavirus 2 by RT PCR NEGATIVE NEGATIVE Final     Comment: (NOTE) SARS-CoV-2 target nucleic acids are NOT DETECTED.  The SARS-CoV-2 RNA is generally detectable in upper and lower respiratory specimens during the acute phase of infection. The lowest concentration of SARS-CoV-2 viral copies this assay can detect is 250 copies / mL. A negative result does not preclude SARS-CoV-2 infection and should not be used as the sole basis for treatment or other patient management decisions.  A negative result may occur with improper specimen collection / handling, submission of specimen other than nasopharyngeal swab, presence of viral mutation(s) within the areas targeted by this assay, and inadequate number of viral copies (<250 copies / mL). A negative result must be combined with clinical observations, patient history, and epidemiological information.  Fact Sheet for Patients:   https://www.patel.info/  Fact Sheet for Healthcare Providers: https://hall.com/  This test is not yet approved or  cleared by the [place] FDA and has been authorized for detection and/or diagnosis of SARS-CoV-2 by FDA under an Emergency Use Authorization (EUA).  This EUA will remain in effect (meaning this test can be used) for the duration of the COVID-19 declaration under Section 564(b)(1) of the Act, 21 U.S.C. section 360bbb-3(b)(1), unless the authorization is terminated or revoked sooner.  Performed at [facility], [address] [place], [place] [address]      Radiological Exams on Admission: CT ABDOMEN PELVIS W CONTRAST  Result Date: 05/02/2022 CLINICAL DATA:  88 year old female with history of acute onset of nonlocalized abdominal pain, nausea and diarrhea. Weakness. History of cholecystectomy on 04/29/2022. EXAM: CT ABDOMEN AND PELVIS WITH CONTRAST TECHNIQUE: Multidetector CT imaging of the abdomen and pelvis was performed using the standard protocol following bolus administration of intravenous  contrast. RADIATION DOSE REDUCTION: This exam was performed according to the departmental dose-optimization program which includes automated exposure control, adjustment of the mA and/or kV according to patient size and/or use of iterative reconstruction technique. CONTRAST:  169m OMNIPAQUE IOHEXOL 300 MG/ML  SOLN COMPARISON:  No prior CT of the abdomen and pelvis. Right upper quadrant abdominal ultrasound 04/29/2022. FINDINGS: Lower chest: Scattered areas  of subsegmental atelectasis are noted in the visualized lung bases. Trace bilateral pleural effusions lying dependently. Hepatobiliary: Multiple well-defined low-attenuation lesions scattered throughout the liver, most of which are subcentimeter in size, too small to definitively characterize, but statistically likely to represent tiny cysts and/or biliary hamartomas (no imaging follow-up is recommended). No intra or extrahepatic biliary ductal dilatation. Status post cholecystectomy. No unexpected postoperative fluid collection noted in the gallbladder fossa. Pancreas: No pancreatic mass. No pancreatic ductal dilatation. No pancreatic or peripancreatic fluid collections or inflammatory changes. Spleen: Unremarkable. Adrenals/Urinary Tract: Bilateral kidneys and bilateral adrenal glands are normal in appearance. No hydroureteronephrosis. Urinary bladder is normal in appearance. Stomach/Bowel: The appearance of the stomach is normal. There is no pathologic dilatation of small bowel or colon. The appendix is not confidently identified and may be surgically absent. Regardless, there are no inflammatory changes noted adjacent to the cecum to suggest the presence of an acute appendicitis at this time. Vascular/Lymphatic: Atherosclerotic calcifications are noted in the abdominal aorta and pelvic vasculature. No lymphadenopathy noted in the abdomen or pelvis. Reproductive: Status post hysterectomy. Ovaries are not confidently identified may be surgically absent or  atrophic. Other: No significant volume of ascites. Trace amount of pneumoperitoneum beneath the right hemidiaphragm, within normal limits given the patient's recent history of surgery. Musculoskeletal: There are no aggressive appearing lytic or blastic lesions noted in the visualized portions of the skeleton. IMPRESSION: 1. Status post cholecystectomy. No unexpected postoperative fluid collections or other acute findings noted on today's examination to account for the patient's symptoms. Trace bilateral pleural effusions lying dependently with areas of passive subsegmental atelectasis in the visualized lung bases. 2. Aortic atherosclerosis. 3. Additional incidental findings, as above. Electronically Signed   By: Vinnie Langton M.D.   On: 05/02/2022 07:54      Assessment/Plan Principal Problem:   Diarrhea Active Problems:   Hx of cholecystectomy   Elevated lipase   Adult hypothyroidism   HTN (hypertension)   Chronic kidney disease, stage 3a (HCC)   Anxiety   Assessment and Plan: * Diarrhea Etiology is not clear.  Not sure if this is may be due to antibiotics use.  Not sure if this is related to recent cholecystectomy.  Consulted Dr. Dahlia Byes of general surgery.  He recommended to hold off antibiotics now.    -Placed on MedSurg bed for observation -IV fluid: 1 L LR, then 75 cc/h of normal saline -Follow-up C. difficile and GI pathogen panel  Hx of cholecystectomy Patient has mild leukocytosis with WBC 11.4, no fever. -Hold off antibiotics as appropriate Dr. Corlis Leak Recommendation  Elevated lipase Lipase is 362.  No abdominal pain.  CT scan did not show any signs of pancreatitis.  May be related lipase elevation may be related to recent cholecystitis and cholecystectomy -Check triglyceride level -Repeat lipase in the morning  Adult hypothyroidism - Continue Synthroid  HTN (hypertension) - IV hydralazine as needed -Hold Cozaar due to worsening renal function  Chronic kidney  disease, stage 3a (HCC) Slightly worsening than baseline.  Recent baseline creatinine 0.98 on 04/30/2022.  Her creatinine is 1.27, BUN 18.  GFR 41.  Likely due to dehydration and continuation of Hyzaar -Hold Hyzaar -hold ibuprofen -IV fluid as above  Anxiety - Continue home Xanax          DVT ppx:  SQ Lovenox  Code Status: Full code  Family Communication:   Yes, patient's husband at bed side.       Disposition Plan:  Anticipate discharge back to previous  environment  Consults called:  Dr. Dahlia Byes of surgery  Admission status and Level of care:  for obs     Dispo: The patient is from: Home              Anticipated d/c is to: Home              Anticipated d/c date is: 1 day              Patient currently is not medically stable to d/c.    Severity of Illness:  The appropriate patient status for this patient is OBSERVATION. Observation status is judged to be reasonable and necessary in order to provide the required intensity of service to ensure the patient's safety. The patient's presenting symptoms, physical exam findings, and initial radiographic and laboratory data in the context of their medical condition is felt to place them at decreased risk for further clinical deterioration. Furthermore, it is anticipated that the patient will be medically stable for discharge from the hospital within 2 midnights of admission.        Date of Service 05/02/2022    [facility]   If 7PM-7AM, please contact night-coverage [email or web address] 05/02/2022, 5:26 PM

## 2022-05-02 NOTE — Consult Note (Signed)
Patient ID: Ana Randall, female   DOB: 01/21/1934, 88 y.o.   MRN: [id number]  HPI Ana Randall is a 88 y.o. female seen in consultation at the request of Dr. Ellender Hose.  She presented with nausea and diarrhea.  No she did have an uneventful robotic cholecystectomy by Dr. Hampton Abbot on September 21 for acute cholecystitis.  This was uneventful and she was discharged the next day with p.o. antibiotics.  She has been taking Cipro and Flagyl and also notices that things that her diarrhea is related to the antibiotics.  Denies any major abdominal pain.  No fevers no chills.  She did have a CT scan of the abdomen pelvis that I personally reviewed showing no evidence of biliary collections no evidence of biliary dilation.  No evidence of pancreatic collections her LFTs were completely normal.  She did have an elevated lipase in the 300s. He does have an acute kidney injury with a creatinine of 1.27 from a baseline of 0.9  HPI  Past Medical History:  Diagnosis Date   Adult hypothyroidism    Alopecia    Anxiety    Arthritis    Chronic headaches    Controlled insomnia    Depression    Essential hypertension, benign    Hemorrhoids    HTN, goal below 150/90    Hypercholesterolemia with hypertriglyceridemia    Hypertensive kidney disease with chronic kidney disease stage III (HCC)    Osteoporosis, senile    Senile osteoporosis    Unspecified hypothyroidism    Vitamin D deficiency     Past Surgical History:  Procedure Laterality Date   ABDOMINAL HYSTERECTOMY  1974   vaginal: partial   CATARACT EXTRACTION Right 04/14/2011   CATARACT EXTRACTION Left 10/19/2010    Family History  Problem Relation Age of Onset   Diabetes Other    Hypertension Mother    Hypertension Father    Diabetes Sister    Breast cancer Daughter 16    Social History Social History   Tobacco Use   Smoking status: Never   Smokeless tobacco: Never  Vaping Use   Vaping Use: Never used  Substance Use Topics   Alcohol  use: No    Alcohol/week: 0.0 standard drinks of alcohol   Drug use: No    Allergies  Allergen Reactions   Azithromycin Nausea Only   Sulfa Antibiotics Other (See Comments)   Amoxicillin-Pot Clavulanate Nausea And Vomiting    Current Facility-Administered Medications  Medication Dose Route Frequency Provider Last Rate Last Admin   0.9 %  sodium chloride infusion   Intravenous Continuous Ivor Costa, MD       acetaminophen (TYLENOL) tablet 650 mg  650 mg Oral Q6H PRN Ivor Costa, MD       albumin human 25 % solution 25 g  25 g Intravenous Once Peniel Biel, [place] F, MD       [START ON 05/03/2022] enoxaparin (LOVENOX) injection 30 mg  30 mg Subcutaneous Q24H Vallery Sa D, RPH       hydrALAZINE (APRESOLINE) injection 5 mg  5 mg Intravenous Q2H PRN Ivor Costa, MD   5 mg at 05/02/22 1115   ondansetron (ZOFRAN) injection 4 mg  4 mg Intravenous Q8H PRN Ivor Costa, MD   4 mg at 05/02/22 0952   promethazine (PHENERGAN) 12.5 mg in sodium chloride 0.9 % 50 mL IVPB  12.5 mg Intravenous Q8H PRN Ivor Costa, MD 200 mL/hr at 05/02/22 1214 12.5 mg at 05/02/22 1214     Review  of Systems Full ROS  was asked and was negative except for the information on the HPI  Physical Exam Blood pressure (!) 168/66, pulse 80, temperature 99.1 F (37.3 C), temperature source Oral, resp. rate 17, height '4\' 11"'$  (1.499 m), weight 60.3 kg, SpO2 93 %. CONSTITUTIONAL: NAD. EYES: Pupils are equal, round, , Sclera are non-icteric. EARS, NOSE, MOUTH AND THROAT: The oral mucosa is pink and moist. Hearing is intact to voice. LYMPH NODES:  Lymph nodes in the neck are normal. RESPIRATORY:  Lungs are clear. There is normal respiratory effort, with equal breath sounds bilaterally, and without pathologic use of accessory muscles. CARDIOVASCULAR: Heart is regular without murmurs, gallops, or rubs. GI: The abdomen is  soft, incisions c/d/I, minimal appropriate incisional tenderness w/o peritonitis, There are no palpable masses. There is no  hepatosplenomegaly.  GU: Rectal deferred.   MUSCULOSKELETAL: Normal muscle strength and tone. No cyanosis or edema.   SKIN: Turgor is good and there are no pathologic skin lesions or ulcers. NEUROLOGIC: Motor and sensation is grossly normal. Cranial nerves are grossly intact. PSYCH:  Oriented to person, place and time. Affect is normal.  Data Reviewed  I have personally reviewed the patient's imaging, laboratory findings and medical records.    Assessment/Plan 88 year old female status postcholecystectomy now with diarrhea and dehydration likely a combination of antibiotics And postcholecystectomy.  At this point recommend holding all antibiotics.  IV hydration and serial abdominal exams.  CT scan is very clean abdominal exam is also benign.  I do not have any high suspicious for complications related to her cholecystectomy.  We will continue to follow her.  I have added LFTs for tomorrow as well as lipase.  There is no need for surgical intervention at this time.   Caroleen Hamman, MD FACS General Surgeon 05/02/2022, 12:20 PM

## 2022-05-02 NOTE — Assessment & Plan Note (Addendum)
-   IV hydralazine as needed -Hold Cozaar due to worsening renal function

## 2022-05-03 DIAGNOSIS — N1831 Chronic kidney disease, stage 3a: Secondary | ICD-10-CM | POA: Diagnosis not present

## 2022-05-03 DIAGNOSIS — E876 Hypokalemia: Secondary | ICD-10-CM

## 2022-05-03 DIAGNOSIS — R197 Diarrhea, unspecified: Secondary | ICD-10-CM | POA: Diagnosis not present

## 2022-05-03 LAB — COMPREHENSIVE METABOLIC PANEL
ALT: 18 U/L (ref 0–44)
AST: 17 U/L (ref 15–41)
Albumin: 3.2 g/dL — ABNORMAL LOW (ref 3.5–5.0)
Alkaline Phosphatase: 29 U/L — ABNORMAL LOW (ref 38–126)
Anion gap: 5 (ref 5–15)
BUN: 12 mg/dL (ref 8–23)
CO2: 24 mmol/L (ref 22–32)
Calcium: 7.8 mg/dL — ABNORMAL LOW (ref 8.9–10.3)
Chloride: 107 mmol/L (ref 98–111)
Creatinine, Ser: 0.92 mg/dL (ref 0.44–1.00)
GFR, Estimated: >60 mL/min (ref >60)
Glucose, Bld: 122 mg/dL — ABNORMAL HIGH (ref 70–99)
Potassium: 3.3 mmol/L — ABNORMAL LOW (ref 3.5–5.1)
Sodium: 136 mmol/L (ref 135–145)
Total Bilirubin: 0.6 mg/dL (ref 0.3–1.2)
Total Protein: 5.3 g/dL — ABNORMAL LOW (ref 6.5–8.1)

## 2022-05-03 LAB — GASTROINTESTINAL PANEL BY PCR, STOOL (REPLACES STOOL CULTURE)

## 2022-05-03 LAB — GLUCOSE, CAPILLARY
Glucose-Capillary: 122 mg/dL — ABNORMAL HIGH (ref 70–99)
Glucose-Capillary: 112 mg/dL — ABNORMAL HIGH (ref 70–99)

## 2022-05-03 LAB — C DIFFICILE QUICK SCREEN W PCR REFLEX
C Diff antigen: NEGATIVE
C Diff interpretation: NOT DETECTED
C Diff toxin: NEGATIVE

## 2022-05-03 LAB — LIPASE, BLOOD: Lipase: 158 U/L — ABNORMAL HIGH (ref 11–51)

## 2022-05-03 LAB — CBC
HCT: 31.9 % — ABNORMAL LOW (ref 36.0–46.0)
Hemoglobin: 11 g/dL — ABNORMAL LOW (ref 12.0–15.0)
MCH: 29.8 pg (ref 26.0–34.0)
MCHC: 34.5 g/dL (ref 30.0–36.0)
MCV: 86.4 fL (ref 80.0–100.0)
Platelets: 272 10*3/uL (ref 150–400)
RBC: 3.69 MIL/uL — ABNORMAL LOW (ref 3.87–5.11)
RDW: 13.2 % (ref 11.5–15.5)
WBC: 8.7 10*3/uL (ref 4.0–10.5)
nRBC: 0 % (ref 0.0–0.2)

## 2022-05-03 MED ORDER — POTASSIUM CHLORIDE CRYS ER 20 MEQ PO TBCR
40.0000 meq | EXTENDED_RELEASE_TABLET | Freq: Two times a day (BID) | ORAL | Status: AC
  Administered 2022-05-03 (×2): 40 meq via ORAL
  Filled 2022-05-03 (×2): qty 2

## 2022-05-03 NOTE — Progress Notes (Signed)
PROGRESS NOTE    Ana Randall  YSA:[id number] DOB: 25-May-1934 DOA: 05/02/2022 PCP: Idelle Crouch, MD   Assessment & Plan:   Principal Problem:   Diarrhea Active Problems:   Hx of cholecystectomy   Elevated lipase   Adult hypothyroidism   HTN (hypertension)   Chronic kidney disease, stage 3a (HCC)   Anxiety  Assessment and Plan: Diarrhea: etiology unclear, possible due to abx use. GI PCR panel & c. diff ordered. Continue on IVFs.   Hypokalemia: potassium ordered   Hx of cholecystectomy: continue w/ supportive care. Will hold abxs currently as per gen surg   Elevated lipase: 362 initially and 158 today. No abd pain. CT scan did not show any signs of pancreatitis.  May be related lipase elevation may be related to recent cholecystitis and cholecystectomy  Hypothyroidism: continue on levothyroxine    HTN: holding losartan, HCTZ. IV hydralazine prn    CKDIIIa: baseline Cr 0.98. Cr is back to baseline. Avoid nephrotoxic meds    Anxiety: severity unknown. Continue on home dose of xanax   Leukocytosis: resolved     DVT prophylaxis: SCDs Code Status: full  Family Communication: discussed pt's care w/ pt's family at bedside and answered their questions  Disposition Plan: likely d/c back home  Level of care: Med-Surg  Status is: Observation The patient remains OBS appropriate and will d/c before 2 midnights.   Consultants:  Gen surg  Procedures:   Antimicrobials:    Subjective: Pt c/o malaise   Objective: Vitals:   05/02/22 1516 05/02/22 1954 05/03/22 0456 05/03/22 0745  BP: (!) 147/60 (!) 144/58 (!) 155/70 (!) 150/60  Pulse: 88 80 74 77  Resp: '14 16 16 12  '$ Temp: 99 F (37.2 C) 99.8 F (37.7 C) 99.9 F (37.7 C) 99.5 F (37.5 C)  TempSrc: Oral  Oral Oral  SpO2: 92% 92% 95% 90%  Weight:      Height:        Intake/Output Summary (Last 24 hours) at 05/03/2022 0843 Last data filed at 05/03/2022 0150 Gross per 24 hour  Intake 1936.05 ml  Output  600 ml  Net 1336.05 ml   Filed Weights   05/02/22 0654  Weight: 60.3 kg    Examination:  General exam: Appears calm and comfortable  Respiratory system: Clear to auscultation. Respiratory effort normal. Cardiovascular system: S1 & S2+. No  rubs, gallops or clicks. Gastrointestinal system: Abdomen is nondistended, soft and nontender. Hyperactive bowel sounds heard. Central nervous system: Alert and oriented. Moves all extremities  Psychiatry: Judgement and insight appear normal. Flat mood and affect    Data Reviewed: I have personally reviewed following labs and imaging studies  CBC: Recent Labs  Lab 04/29/22 0544 04/30/22 0558 05/02/22 0713 05/03/22 0634  WBC 11.7* 13.1* 11.4* 8.7  NEUTROABS 9.3*  --  8.2*  --   HGB 13.8 11.5* 12.9 11.0*  HCT 42.0 33.8* 37.9 31.9*  MCV 91.1 86.7 87.7 86.4  PLT 271 261 276 323   Basic Metabolic Panel: Recent Labs  Lab 04/29/22 0856 04/30/22 0558 05/02/22 0713 05/03/22 0634  NA 136 135 136 136  K 4.4 4.0 3.6 3.3*  CL 102 107 105 107  CO2 '27 23 23 24  '$ GLUCOSE 145* 154* 126* 122*  BUN '17 14 18 12  '$ CREATININE 0.98 0.98 1.27* 0.92  CALCIUM 9.5 8.3* 8.5* 7.8*   GFR: Estimated Creatinine Clearance: 35.3 mL/min (by C-G formula based on SCr of 0.92 mg/dL). Liver Function Tests: Recent Labs  Lab  04/29/22 0856 04/30/22 0558 05/02/22 0713 05/03/22 0634  AST '21 31 21 17  '$ ALT 19 42 30 18  ALKPHOS 37* 33* 36* 29*  BILITOT 0.6 0.5 0.7 0.6  PROT 6.6 5.8* 6.4* 5.3*  ALBUMIN 3.9 3.2* 3.6 3.2*   Recent Labs  Lab 04/29/22 0856 05/02/22 0713 05/03/22 0634  LIPASE 44 362* 158*   No results for input(s): "AMMONIA" in the last 168 hours. Coagulation Profile: No results for input(s): "INR", "PROTIME" in the last 168 hours. Cardiac Enzymes: No results for input(s): "CKTOTAL", "CKMB", "CKMBINDEX", "TROPONINI" in the last 168 hours. BNP (last 3 results) No results for input(s): "PROBNP" in the last 8760 hours. HbA1C: No results for  input(s): "HGBA1C" in the last 72 hours. CBG: Recent Labs  Lab 05/02/22 1053 05/03/22 0743  GLUCAP 121* 122*   Lipid Profile: Recent Labs    05/02/22 0713  TRIG 98   Thyroid Function Tests: No results for input(s): "TSH", "T4TOTAL", "FREET4", "T3FREE", "THYROIDAB" in the last 72 hours. Anemia Panel: No results for input(s): "VITAMINB12", "FOLATE", "FERRITIN", "TIBC", "IRON", "RETICCTPCT" in the last 72 hours. Sepsis Labs: Recent Labs  Lab 05/02/22 0713  LATICACIDVEN 1.0    Recent Results (from the past 240 hour(s))  SARS Coronavirus 2 by RT PCR (hospital order, performed in [facility] hospital lab) *cepheid single result test* Anterior Nasal Swab     Status: None   Collection Time: 04/29/22  7:44 AM   Specimen: Anterior Nasal Swab  Result Value Ref Range Status   SARS Coronavirus 2 by RT PCR NEGATIVE NEGATIVE Final    Comment: (NOTE) SARS-CoV-2 target nucleic acids are NOT DETECTED.  The SARS-CoV-2 RNA is generally detectable in upper and lower respiratory specimens during the acute phase of infection. The lowest concentration of SARS-CoV-2 viral copies this assay can detect is 250 copies / mL. A negative result does not preclude SARS-CoV-2 infection and should not be used as the sole basis for treatment or other patient management decisions.  A negative result may occur with improper specimen collection / handling, submission of specimen other than nasopharyngeal swab, presence of viral mutation(s) within the areas targeted by this assay, and inadequate number of viral copies (<250 copies / mL). A negative result must be combined with clinical observations, patient history, and epidemiological information.  Fact Sheet for Patients:   https://www.patel.info/  Fact Sheet for Healthcare Providers: https://hall.com/  This test is not yet approved or  cleared by the [place] FDA and has been authorized for detection  and/or diagnosis of SARS-CoV-2 by FDA under an Emergency Use Authorization (EUA).  This EUA will remain in effect (meaning this test can be used) for the duration of the COVID-19 declaration under Section 564(b)(1) of the Act, 21 U.S.C. section 360bbb-3(b)(1), unless the authorization is terminated or revoked sooner.  Performed at [facility], [address] [place], [place] [address]   SARS Coronavirus 2 by RT PCR (hospital order, performed in [facility] hospital lab) *cepheid single result test* Anterior Nasal Swab     Status: None   Collection Time: 05/02/22  7:14 AM   Specimen: Anterior Nasal Swab  Result Value Ref Range Status   SARS Coronavirus 2 by RT PCR NEGATIVE NEGATIVE Final    Comment: (NOTE) SARS-CoV-2 target nucleic acids are NOT DETECTED.  The SARS-CoV-2 RNA is generally detectable in upper and lower respiratory specimens during the acute phase of infection. The lowest concentration of SARS-CoV-2 viral copies this assay can detect is 250 copies /  mL. A negative result does not preclude SARS-CoV-2 infection and should not be used as the sole basis for treatment or other patient management decisions.  A negative result may occur with improper specimen collection / handling, submission of specimen other than nasopharyngeal swab, presence of viral mutation(s) within the areas targeted by this assay, and inadequate number of viral copies (<250 copies / mL). A negative result must be combined with clinical observations, patient history, and epidemiological information.  Fact Sheet for Patients:   https://www.patel.info/  Fact Sheet for Healthcare Providers: https://hall.com/  This test is not yet approved or  cleared by the [place] FDA and has been authorized for detection and/or diagnosis of SARS-CoV-2 by FDA under an Emergency Use Authorization (EUA).  This EUA will remain in effect (meaning this test can  be used) for the duration of the COVID-19 declaration under Section 564(b)(1) of the Act, 21 U.S.C. section 360bbb-3(b)(1), unless the authorization is terminated or revoked sooner.  Performed at [facility], [address], [place], [place] [address]          Radiology Studies: CT ABDOMEN PELVIS W CONTRAST  Result Date: 05/02/2022 CLINICAL DATA:  88 year old female with history of acute onset of nonlocalized abdominal pain, nausea and diarrhea. Weakness. History of cholecystectomy on 04/29/2022. EXAM: CT ABDOMEN AND PELVIS WITH CONTRAST TECHNIQUE: Multidetector CT imaging of the abdomen and pelvis was performed using the standard protocol following bolus administration of intravenous contrast. RADIATION DOSE REDUCTION: This exam was performed according to the departmental dose-optimization program which includes automated exposure control, adjustment of the mA and/or kV according to patient size and/or use of iterative reconstruction technique. CONTRAST:  151m OMNIPAQUE IOHEXOL 300 MG/ML  SOLN COMPARISON:  No prior CT of the abdomen and pelvis. Right upper quadrant abdominal ultrasound 04/29/2022. FINDINGS: Lower chest: Scattered areas of subsegmental atelectasis are noted in the visualized lung bases. Trace bilateral pleural effusions lying dependently. Hepatobiliary: Multiple well-defined low-attenuation lesions scattered throughout the liver, most of which are subcentimeter in size, too small to definitively characterize, but statistically likely to represent tiny cysts and/or biliary hamartomas (no imaging follow-up is recommended). No intra or extrahepatic biliary ductal dilatation. Status post cholecystectomy. No unexpected postoperative fluid collection noted in the gallbladder fossa. Pancreas: No pancreatic mass. No pancreatic ductal dilatation. No pancreatic or peripancreatic fluid collections or inflammatory changes. Spleen: Unremarkable. Adrenals/Urinary Tract: Bilateral  kidneys and bilateral adrenal glands are normal in appearance. No hydroureteronephrosis. Urinary bladder is normal in appearance. Stomach/Bowel: The appearance of the stomach is normal. There is no pathologic dilatation of small bowel or colon. The appendix is not confidently identified and may be surgically absent. Regardless, there are no inflammatory changes noted adjacent to the cecum to suggest the presence of an acute appendicitis at this time. Vascular/Lymphatic: Atherosclerotic calcifications are noted in the abdominal aorta and pelvic vasculature. No lymphadenopathy noted in the abdomen or pelvis. Reproductive: Status post hysterectomy. Ovaries are not confidently identified may be surgically absent or atrophic. Other: No significant volume of ascites. Trace amount of pneumoperitoneum beneath the right hemidiaphragm, within normal limits given the patient's recent history of surgery. Musculoskeletal: There are no aggressive appearing lytic or blastic lesions noted in the visualized portions of the skeleton. IMPRESSION: 1. Status post cholecystectomy. No unexpected postoperative fluid collections or other acute findings noted on today's examination to account for the patient's symptoms. Trace bilateral pleural effusions lying dependently with areas of passive subsegmental atelectasis in the visualized lung bases. 2. Aortic atherosclerosis. 3. Additional  incidental findings, as above. Electronically Signed   By: Vinnie Langton M.D.   On: 05/02/2022 07:54        Scheduled Meds:  cholecalciferol  1,000 Units Oral Daily   enoxaparin (LOVENOX) injection  30 mg Subcutaneous Q24H   levothyroxine  50 mcg Oral Q0600   multivitamin with minerals  1 tablet Oral Q1400   omega-3 acid ethyl esters  1 g Oral Daily   Continuous Infusions:  sodium chloride 75 mL/hr at 05/03/22 0509     LOS: 0 days    Time spent: 35 mins    Wyvonnia Dusky, MD [facility] Pager [phone]  If 7PM-7AM,  please contact night-coverage [email or web address] 05/03/2022, 8:43 AM

## 2022-05-03 NOTE — Progress Notes (Signed)
Mobility Specialist - Progress Note   05/03/22 1010  Mobility  Activity Stood at bedside;Dangled on edge of bed;Ambulated with assistance in hallway  Level of Assistance Independent  Assistive Device None  Distance Ambulated (ft) 160 ft  Activity Response Tolerated well  $Mobility charge 1 Mobility   upine in bed on RA upon arrival. Pt STS with HHA and ambulates in hallway SBA. No LOB or complaints. Pt returns to room with needs in reach and family in room.   Gretchen Short  Mobility Specialist  05/03/22 10:13 AM

## 2022-05-03 NOTE — Progress Notes (Signed)
[place] [facility] Hospital Day(s): 0.   Post op day(s): 4  Interval History:  Patient seen and examined No acute events or new complaints overnight.  Patient reports she is feeling much better; anxious to go home No abdominal pain Previous nausea, emesis, and diarrhea resolved Renal function is improved; sCr - 0.92: UO  -600 ccs + unmeasured   Hypokalemia to 3.3 AST/ALT normal at 17/18 respectively No hyperbilirubinemia; 0.6 Lipase only mildly elevated at 158 Previously seen leukocytosis resolved; 8.7K She is on CLD; tolerated  Vital signs in last 24 hours: [min-max] current  Temp:  [99 F (37.2 C)-99.9 F (37.7 C)] 99.9 F (37.7 C) (09/25 0456) Pulse Rate:  [72-88] 74 (09/25 0456) Resp:  [14-18] 16 (09/25 0456) BP: (144-177)/(55-70) 155/70 (09/25 0456) SpO2:  [92 %-96 %] 95 % (09/25 0456)     Height: '4\' 11"'$  (149.9 cm) Weight: 60.3 kg BMI (Calculated): 26.85   Intake/Output last 2 shifts:  09/24 0701 - 09/25 0700 In: 1936.1 [P.O.:180; I.V.:606.1; IV Piggyback:1150] Out: 600 [Urine:600]   Physical Exam:  Constitutional: alert, cooperative and no distress  Respiratory: breathing non-labored at rest  Cardiovascular: regular rate and sinus rhythm  Gastrointestinal: soft, non-tender, non-distended, no rebound/guarding Integumentary: Laparoscopic incisions are CDI with dermabond, no erythema or drainage, some ecchymosis   Labs:     Latest Ref Rng & Units 05/03/2022    6:34 AM 05/02/2022    7:13 AM 04/30/2022    5:58 AM  CBC  WBC 4.0 - 10.5 K/uL 8.7  11.4  13.1   Hemoglobin 12.0 - 15.0 g/dL 11.0  12.9  11.5   Hematocrit 36.0 - 46.0 % 31.9  37.9  33.8   Platelets 150 - 400 K/uL 272  276  261       Latest Ref Rng & Units 05/03/2022    6:34 AM 05/02/2022    7:13 AM 04/30/2022    5:58 AM  CMP  Glucose 70 - 99 mg/dL 122  126  154   BUN 8 - 23 mg/dL '12  18  14   '$ Creatinine 0.44 - 1.00 mg/dL 0.92  1.27  0.98   Sodium 135 - 145 mmol/L 136   136  135   Potassium 3.5 - 5.1 mmol/L 3.3  3.6  4.0   Chloride 98 - 111 mmol/L 107  105  107   CO2 22 - 32 mmol/L '24  23  23   '$ Calcium 8.9 - 10.3 mg/dL 7.8  8.5  8.3   Total Protein 6.5 - 8.1 g/dL 5.3  6.4  5.8   Total Bilirubin 0.3 - 1.2 mg/dL 0.6  0.7  0.5   Alkaline Phos 38 - 126 U/L 29  36  33   AST 15 - 41 U/L '17  21  31   '$ ALT 0 - 44 U/L 18  30  42     Imaging studies: No new pertinent imaging studies   Assessment/Plan:  88 y.o. female with now resolved nausea/emesis, and diarrhea thought to be secondary to Abx otherwise doing very well without any evidence of further complication 4 days s/p robotic assisted laparoscopic cholecystectomy for acute cholecystitis   - Given clinical improvement, I think it is reasonable to restart diet if she tolerates CLD for breakfast  - Continue to hold/stop Abx; no evidence of infection  - Wean from IVF once diet advanced  - Monitor abdominal examination; on-going bowel function  - Pain control prn; antiemetics prn   -  Mobilize as tolerated   - Further management per primary service  - Discharge Planning: If she tolerates diet advancement without nausea/emesis/diarrhea, I think it would be reasonable to DC home this afternoon vs tomorrow morning. She can follow up as scheduled on 10/12.    All of the above findings and recommendations were discussed with the patient, patient's family (husband at bedside), and the medical team, and all of their questions were answered to their expressed satisfaction.  -- Edison Simon, PA-C [facility] 05/03/2022, 7:39 AM M-F: 7am - 4pm

## 2022-05-03 NOTE — Plan of Care (Signed)

## 2022-05-04 DIAGNOSIS — R197 Diarrhea, unspecified: Secondary | ICD-10-CM | POA: Diagnosis not present

## 2022-05-04 DIAGNOSIS — N1831 Chronic kidney disease, stage 3a: Secondary | ICD-10-CM | POA: Diagnosis not present

## 2022-05-04 LAB — CBC
HCT: 34.5 % — ABNORMAL LOW (ref 36.0–46.0)
Hemoglobin: 11.7 g/dL — ABNORMAL LOW (ref 12.0–15.0)
MCH: 29.8 pg (ref 26.0–34.0)
MCHC: 33.9 g/dL (ref 30.0–36.0)
MCV: 87.8 fL (ref 80.0–100.0)
Platelets: 291 10*3/uL (ref 150–400)
RBC: 3.93 MIL/uL (ref 3.87–5.11)
RDW: 13 % (ref 11.5–15.5)
WBC: 8.7 10*3/uL (ref 4.0–10.5)
nRBC: 0 % (ref 0.0–0.2)

## 2022-05-04 LAB — COMPREHENSIVE METABOLIC PANEL
ALT: 17 U/L (ref 0–44)
AST: 16 U/L (ref 15–41)
Albumin: 3.2 g/dL — ABNORMAL LOW (ref 3.5–5.0)
Alkaline Phosphatase: 31 U/L — ABNORMAL LOW (ref 38–126)
Anion gap: 4 — ABNORMAL LOW (ref 5–15)
BUN: 10 mg/dL (ref 8–23)
CO2: 22 mmol/L (ref 22–32)
Calcium: 7.7 mg/dL — ABNORMAL LOW (ref 8.9–10.3)
Chloride: 109 mmol/L (ref 98–111)
Creatinine, Ser: 0.89 mg/dL (ref 0.44–1.00)
GFR, Estimated: >60 mL/min (ref >60)
Glucose, Bld: 106 mg/dL — ABNORMAL HIGH (ref 70–99)
Potassium: 4.1 mmol/L (ref 3.5–5.1)
Sodium: 135 mmol/L (ref 135–145)
Total Bilirubin: 0.9 mg/dL (ref 0.3–1.2)
Total Protein: 5.7 g/dL — ABNORMAL LOW (ref 6.5–8.1)

## 2022-05-04 LAB — SURGICAL PATHOLOGY

## 2022-05-04 LAB — GLUCOSE, CAPILLARY: Glucose-Capillary: 135 mg/dL — ABNORMAL HIGH (ref 70–99)

## 2022-05-04 LAB — MAGNESIUM: Magnesium: 1.4 mg/dL — ABNORMAL LOW (ref 1.7–2.4)

## 2022-05-04 MED ORDER — HYDROCHLOROTHIAZIDE 25 MG PO TABS
25.0000 mg | ORAL_TABLET | Freq: Every day | ORAL | Status: DC
  Administered 2022-05-04: 25 mg via ORAL
  Filled 2022-05-04: qty 1

## 2022-05-04 MED ORDER — LOSARTAN POTASSIUM 50 MG PO TABS
100.0000 mg | ORAL_TABLET | Freq: Every day | ORAL | Status: DC
  Administered 2022-05-04: 100 mg via ORAL
  Filled 2022-05-04: qty 2

## 2022-05-04 MED ORDER — MAGNESIUM SULFATE 2 GM/50ML IV SOLN
2.0000 g | Freq: Once | INTRAVENOUS | Status: AC
  Administered 2022-05-04: 2 g via INTRAVENOUS
  Filled 2022-05-04: qty 50

## 2022-05-04 MED ORDER — ENOXAPARIN SODIUM 40 MG/0.4ML IJ SOSY: 40.0000 mg | PREFILLED_SYRINGE | INTRAMUSCULAR | Status: DC

## 2022-05-04 NOTE — Progress Notes (Signed)
Mobility Specialist - Progress Note   05/04/22 0911  Mobility  Activity Ambulated independently in hallway;Stood at bedside;Dangled on edge of bed  Level of Assistance Independent  Assistive Device None  Distance Ambulated (ft) 320 ft  Activity Response Tolerated well  $Mobility charge 1 Mobility   Pt EOB on RA upon arrival. Pt STS and ambulates in hallway indep with no LOB. Pt returns to EOB with needs in reach and family in room.   Gretchen Short  Mobility Specialist  05/04/22 9:11 AM

## 2022-05-04 NOTE — Discharge Summary (Signed)
Physician Discharge Summary  Ana Randall FBP:[id number] DOB: October 08, 1933 DOA: 05/02/2022  PCP: Idelle Crouch, MD  Admit date: 05/02/2022 Discharge date: 05/04/2022  Admitted From: home  Disposition:  home   Recommendations for Outpatient Follow-up:  Follow up with PCP in 1-2 weeks F/u w/ general surg on 05/20/22  Home Health:  Equipment/Devices:  Discharge Condition: stable  CODE STATUS: full  Diet recommendation: soft diet and advance as tolerated  Brief/Interim Summary: HPI was taken from Dr. Blaine Hamper: Ana Randall is a 88 y.o. female with medical history significant of hypertension, hyperlipidemia, hypothyroidism, depression with anxiety, CKD stage IIIa, s/p of right cholecystectomy, who presents with diarrhea.   Patient was recently hospitalized from 9/21 - 9/22 due to gallstone cholecystitis.  Patient is s/p of robotic assisted laparoscopic cholecystectomy.  Patient was discharged on Cipro and Flagyl on 9/22 for 1 week.  Patient states that after she her first dose of antibiotics at home yesterday, began to have multiple episodes of profuse, watery, diarrhea.  She has nausea, no vomiting.  Denies abdominal pain.  No fever or chills.  Denies chest pain, cough, shortness breath.  No symptoms of UTI.   Data reviewed independently and ED Course: pt was found to have lipase 362, liver function normal, WBC 11.4, lactic acid 1.0, negative COVID PCR, slightly worsening renal function, temperature normal, blood pressure 170/58, heart rate 72, RR 31, 17.  Patient is placed on MedSurg bed for position.  Dr. Dahlia Byes of surgery is consulted.  As per Dr. Jimmye Norman 9/25-9/26/23: Pt presented w/ diarrhea likely secondary to recent abx use. C. diff & GI PCR panel were both neg. Outpatient abxs were not continued inpatient. Pt was tolerating a soft diet prior to d/c. Pt will f/u w/ general surg as previously scheduled on 05/20/22 as pt recently had a cholecystectomy. For more information, please see  previous progress/consult notes.   Discharge Diagnoses:  Principal Problem:   Diarrhea Active Problems:   Hx of cholecystectomy   Elevated lipase   Adult hypothyroidism   HTN (hypertension)   Chronic kidney disease, stage 3a (HCC)   Anxiety  Diarrhea: etiology unclear, possible due to abx use. C. diff & GI PCR panel are both neg. Continue on IVFs. Much improved  Hypokalemia: WNL today  Hypomagnesemia: mg sulfate given    Hx of cholecystectomy: continue w/ supportive care. Will hold abxs currently as per gen surg   Elevated lipase: 362 initially and 158 today. No abd pain. CT scan did not show any signs of pancreatitis.  May be related lipase elevation may be related to recent cholecystitis and cholecystectomy  Hypothyroidism: continue on levothyroxine    HTN: restart losartan, HCTZ. IV hydralazine prn    CKDIIIa: baseline Cr 0.98. Cr is back to baseline. Avoid nephrotoxic meds    Anxiety: severity unknown. Continue on home dose of xanax   Leukocytosis: resolved   Discharge Instructions  Discharge Instructions     Diet - low sodium heart healthy   Complete by: As directed    Advance diet as tolerated   Discharge instructions   Complete by: As directed    F/u w/ PCP in 1-2 weeks. F/u w/ general surg on 05/20/22   Increase activity slowly   Complete by: As directed       Allergies as of 05/04/2022       Reactions   Cipro [ciprofloxacin Hcl] Nausea And Vomiting   Azithromycin Nausea Only   Sulfa Antibiotics Other (See Comments)   Amoxicillin-pot  Clavulanate Nausea And Vomiting        Medication List     STOP taking these medications    ciprofloxacin 500 MG tablet Commonly known as: Cipro   metroNIDAZOLE 500 MG tablet Commonly known as: Flagyl       TAKE these medications    acetaminophen 500 MG tablet Commonly known as: TYLENOL Take 1,000 mg by mouth every 6 (six) hours as needed (Headache).   ALPRAZolam 0.5 MG tablet Commonly known as:  XANAX Take 0.5 mg by mouth 3 (three) times daily as needed for anxiety or sleep.   APPLE CIDER VINEGAR PO Take 1 tablet by mouth daily.   CALTRATE 600+D PO Take 1 tablet by mouth daily.   cetirizine-pseudoephedrine 5-120 MG tablet Commonly known as: ZYRTEC-D Take 1 tablet by mouth daily as needed for allergies.   DHA-EPA-VITAMIN E PO Take by mouth.   Fish Oil 1200 MG Caps Take 1 capsule by mouth daily.   ibuprofen 600 MG tablet Commonly known as: ADVIL Take 1 tablet (600 mg total) by mouth every 6 (six) hours as needed.   levothyroxine 50 MCG tablet Commonly known as: SYNTHROID Take 1 tablet by mouth  daily in the morning on an  empty stomach   losartan-hydrochlorothiazide 100-25 MG tablet Commonly known as: HYZAAR Take 1 tablet by mouth daily.   MULTIVITAMIN ADULT PO Take by mouth.   ondansetron 4 MG disintegrating tablet Commonly known as: ZOFRAN-ODT Take 1 tablet (4 mg total) by mouth every 6 (six) hours as needed for nausea.   oxyCODONE 5 MG immediate release tablet Commonly known as: Oxy IR/ROXICODONE Take 1-2 tablets (5-10 mg total) by mouth every 4 (four) hours as needed for moderate pain.   Vitamin D3 25 MCG (1000 UT) Caps Take by mouth.        Allergies  Allergen Reactions   Cipro [Ciprofloxacin Hcl] Nausea And Vomiting   Azithromycin Nausea Only   Sulfa Antibiotics Other (See Comments)   Amoxicillin-Pot Clavulanate Nausea And Vomiting    Consultations: General surg: Dr. Dahlia Byes   Procedures/Studies: CT ABDOMEN PELVIS W CONTRAST  Result Date: 05/02/2022 CLINICAL DATA:  88 year old female with history of acute onset of nonlocalized abdominal pain, nausea and diarrhea. Weakness. History of cholecystectomy on 04/29/2022. EXAM: CT ABDOMEN AND PELVIS WITH CONTRAST TECHNIQUE: Multidetector CT imaging of the abdomen and pelvis was performed using the standard protocol following bolus administration of intravenous contrast. RADIATION DOSE REDUCTION: This  exam was performed according to the departmental dose-optimization program which includes automated exposure control, adjustment of the mA and/or kV according to patient size and/or use of iterative reconstruction technique. CONTRAST:  110m OMNIPAQUE IOHEXOL 300 MG/ML  SOLN COMPARISON:  No prior CT of the abdomen and pelvis. Right upper quadrant abdominal ultrasound 04/29/2022. FINDINGS: Lower chest: Scattered areas of subsegmental atelectasis are noted in the visualized lung bases. Trace bilateral pleural effusions lying dependently. Hepatobiliary: Multiple well-defined low-attenuation lesions scattered throughout the liver, most of which are subcentimeter in size, too small to definitively characterize, but statistically likely to represent tiny cysts and/or biliary hamartomas (no imaging follow-up is recommended). No intra or extrahepatic biliary ductal dilatation. Status post cholecystectomy. No unexpected postoperative fluid collection noted in the gallbladder fossa. Pancreas: No pancreatic mass. No pancreatic ductal dilatation. No pancreatic or peripancreatic fluid collections or inflammatory changes. Spleen: Unremarkable. Adrenals/Urinary Tract: Bilateral kidneys and bilateral adrenal glands are normal in appearance. No hydroureteronephrosis. Urinary bladder is normal in appearance. Stomach/Bowel: The appearance of the stomach is normal. There  is no pathologic dilatation of small bowel or colon. The appendix is not confidently identified and may be surgically absent. Regardless, there are no inflammatory changes noted adjacent to the cecum to suggest the presence of an acute appendicitis at this time. Vascular/Lymphatic: Atherosclerotic calcifications are noted in the abdominal aorta and pelvic vasculature. No lymphadenopathy noted in the abdomen or pelvis. Reproductive: Status post hysterectomy. Ovaries are not confidently identified may be surgically absent or atrophic. Other: No significant volume of  ascites. Trace amount of pneumoperitoneum beneath the right hemidiaphragm, within normal limits given the patient's recent history of surgery. Musculoskeletal: There are no aggressive appearing lytic or blastic lesions noted in the visualized portions of the skeleton. IMPRESSION: 1. Status post cholecystectomy. No unexpected postoperative fluid collections or other acute findings noted on today's examination to account for the patient's symptoms. Trace bilateral pleural effusions lying dependently with areas of passive subsegmental atelectasis in the visualized lung bases. 2. Aortic atherosclerosis. 3. Additional incidental findings, as above. Electronically Signed   By: Vinnie Langton M.D.   On: 05/02/2022 07:54   US ABDOMEN LIMITED RUQ (LIVER/GB)  Result Date: 04/29/2022 CLINICAL DATA:  Right upper quadrant pain with nausea and vomiting. EXAM: ULTRASOUND ABDOMEN LIMITED RIGHT UPPER QUADRANT COMPARISON:  04/08/2011 FINDINGS: Gallbladder: Multiple gallstones identified measuring up to 2.1 cm. There appears to be a stone lodged in the neck of the gallbladder, despite patient repositioning. Gallbladder wall thickness within normal limits at 2 mm. Sonographer reports no sonographic Murphy sign. Common bile duct: Diameter: 4-5 mm Liver: Increased echogenicity of liver parenchyma suggests fatty deposition. Portal vein is patent on color Doppler imaging with normal direction of blood flow towards the liver. Other: None. IMPRESSION: 1. Cholelithiasis without gallbladder wall thickening or pericholecystic fluid. There appears to be a stone lodged in the neck of the gallbladder. 2. No biliary dilatation. Electronically Signed   By: Misty Stanley M.D.   On: 04/29/2022 08:44   (Echo, Carotid, EGD, Colonoscopy, ERCP)    Subjective: Pt denies any complaints    Discharge Exam: Vitals:   05/04/22 0416 05/04/22 0743  BP: (!) 168/93 (!) 174/70  Pulse: 77 80  Resp: 20 12  Temp: 98.8 F (37.1 C) 99.2 F (37.3 C)   SpO2: 95% 95%   Vitals:   05/03/22 1554 05/03/22 2010 05/04/22 0416 05/04/22 0743  BP: (!) 158/66 (!) 161/70 (!) 168/93 (!) 174/70  Pulse: 72 74 77 80  Resp:  '20 20 12  '$ Temp: 98.7 F (37.1 C) 99.5 F (37.5 C) 98.8 F (37.1 C) 99.2 F (37.3 C)  TempSrc: Oral Oral Oral Oral  SpO2: 93% 92% 95% 95%  Weight:      Height:        General: Pt is alert, awake, not in acute distress Cardiovascular:S1/S2 +, no rubs, no gallops Respiratory: CTA bilaterally, no wheezing, no rhonchi Abdominal: Soft, NT, ND, bowel sounds + Extremities: no edema, no cyanosis    The results of significant diagnostics from this hospitalization (including imaging, microbiology, ancillary and laboratory) are listed below for reference.     Microbiology: Recent Results (from the past 240 hour(s))  SARS Coronavirus 2 by RT PCR (hospital order, performed in [facility] hospital lab) *cepheid single result test* Anterior Nasal Swab     Status: None   Collection Time: 04/29/22  7:44 AM   Specimen: Anterior Nasal Swab  Result Value Ref Range Status   SARS Coronavirus 2 by RT PCR NEGATIVE NEGATIVE Final    Comment: (NOTE)  SARS-CoV-2 target nucleic acids are NOT DETECTED.  The SARS-CoV-2 RNA is generally detectable in upper and lower respiratory specimens during the acute phase of infection. The lowest concentration of SARS-CoV-2 viral copies this assay can detect is 250 copies / mL. A negative result does not preclude SARS-CoV-2 infection and should not be used as the sole basis for treatment or other patient management decisions.  A negative result may occur with improper specimen collection / handling, submission of specimen other than nasopharyngeal swab, presence of viral mutation(s) within the areas targeted by this assay, and inadequate number of viral copies (<250 copies / mL). A negative result must be combined with clinical observations, patient history, and epidemiological information.  Fact Sheet  for Patients:   https://www.patel.info/  Fact Sheet for Healthcare Providers: https://hall.com/  This test is not yet approved or  cleared by the [place] FDA and has been authorized for detection and/or diagnosis of SARS-CoV-2 by FDA under an Emergency Use Authorization (EUA).  This EUA will remain in effect (meaning this test can be used) for the duration of the COVID-19 declaration under Section 564(b)(1) of the Act, 21 U.S.C. section 360bbb-3(b)(1), unless the authorization is terminated or revoked sooner.  Performed at [facility], [address] [place], [place] [address]   SARS Coronavirus 2 by RT PCR (hospital order, performed in [facility] hospital lab) *cepheid single result test* Anterior Nasal Swab     Status: None   Collection Time: 05/02/22  7:14 AM   Specimen: Anterior Nasal Swab  Result Value Ref Range Status   SARS Coronavirus 2 by RT PCR NEGATIVE NEGATIVE Final    Comment: (NOTE) SARS-CoV-2 target nucleic acids are NOT DETECTED.  The SARS-CoV-2 RNA is generally detectable in upper and lower respiratory specimens during the acute phase of infection. The lowest concentration of SARS-CoV-2 viral copies this assay can detect is 250 copies / mL. A negative result does not preclude SARS-CoV-2 infection and should not be used as the sole basis for treatment or other patient management decisions.  A negative result may occur with improper specimen collection / handling, submission of specimen other than nasopharyngeal swab, presence of viral mutation(s) within the areas targeted by this assay, and inadequate number of viral copies (<250 copies / mL). A negative result must be combined with clinical observations, patient history, and epidemiological information.  Fact Sheet for Patients:   https://www.patel.info/  Fact Sheet for Healthcare  Providers: https://hall.com/  This test is not yet approved or  cleared by the [place] FDA and has been authorized for detection and/or diagnosis of SARS-CoV-2 by FDA under an Emergency Use Authorization (EUA).  This EUA will remain in effect (meaning this test can be used) for the duration of the COVID-19 declaration under Section 564(b)(1) of the Act, 21 U.S.C. section 360bbb-3(b)(1), unless the authorization is terminated or revoked sooner.  Performed at [facility], [place], [place] [address]   C Difficile Quick Screen w PCR reflex     Status: None   Collection Time: 05/02/22  5:30 PM   Specimen: STOOL  Result Value Ref Range Status   C Diff antigen NEGATIVE NEGATIVE Final   C Diff toxin NEGATIVE NEGATIVE Final   C Diff interpretation No C. difficile detected.  Final    Comment: Performed at [facility], [address], [place], [place] [address]  Gastrointestinal Panel by PCR , Stool     Status: None   Collection Time: 05/02/22  5:30 PM   Specimen: Stool  Result Value Ref Range Status   Campylobacter species NOT DETECTED NOT DETECTED Final   Plesimonas shigelloides NOT DETECTED NOT DETECTED Final   Salmonella species NOT DETECTED NOT DETECTED Final   Yersinia enterocolitica NOT DETECTED NOT DETECTED Final   Vibrio species NOT DETECTED NOT DETECTED Final   Vibrio cholerae NOT DETECTED NOT DETECTED Final   Enteroaggregative E coli (EAEC) NOT DETECTED NOT DETECTED Final   Enteropathogenic E coli (EPEC) NOT DETECTED NOT DETECTED Final   Enterotoxigenic E coli (ETEC) NOT DETECTED NOT DETECTED Final   Shiga like toxin producing E coli (STEC) NOT DETECTED NOT DETECTED Final   Shigella/Enteroinvasive E coli (EIEC) NOT DETECTED NOT DETECTED Final   Cryptosporidium NOT DETECTED NOT DETECTED Final   Cyclospora cayetanensis NOT DETECTED NOT DETECTED Final   Entamoeba histolytica NOT DETECTED NOT DETECTED Final    Giardia lamblia NOT DETECTED NOT DETECTED Final   Adenovirus F40/41 NOT DETECTED NOT DETECTED Final   Astrovirus NOT DETECTED NOT DETECTED Final   Norovirus GI/GII NOT DETECTED NOT DETECTED Final   Rotavirus A NOT DETECTED NOT DETECTED Final   Sapovirus (I, II, IV, and V) NOT DETECTED NOT DETECTED Final    Comment: Performed at [facility], [address] [place], [place] [address]     Labs: BNP (last 3 results) No results for input(s): "BNP" in the last 8760 hours. Basic Metabolic Panel: Recent Labs  Lab 04/29/22 0856 04/30/22 0558 05/02/22 0713 05/03/22 0634 05/04/22 0459  NA 136 135 136 136 135  K 4.4 4.0 3.6 3.3* 4.1  CL 102 107 105 107 109  CO2 '27 23 23 24 22  '$ GLUCOSE 145* 154* 126* 122* 106*  BUN '17 14 18 12 10  '$ CREATININE 0.98 0.98 1.27* 0.92 0.89  CALCIUM 9.5 8.3* 8.5* 7.8* 7.7*  MG  --   --   --   --  1.4*   Liver Function Tests: Recent Labs  Lab 04/29/22 0856 04/30/22 0558 05/02/22 0713 05/03/22 0634 05/04/22 0459  AST '21 31 21 17 16  '$ ALT 19 42 '30 18 17  '$ ALKPHOS 37* 33* 36* 29* 31*  BILITOT 0.6 0.5 0.7 0.6 0.9  PROT 6.6 5.8* 6.4* 5.3* 5.7*  ALBUMIN 3.9 3.2* 3.6 3.2* 3.2*   Recent Labs  Lab 04/29/22 0856 05/02/22 0713 05/03/22 0634  LIPASE 44 362* 158*   No results for input(s): "AMMONIA" in the last 168 hours. CBC: Recent Labs  Lab 04/29/22 0544 04/30/22 0558 05/02/22 0713 05/03/22 0634 05/04/22 0459  WBC 11.7* 13.1* 11.4* 8.7 8.7  NEUTROABS 9.3*  --  8.2*  --   --   HGB 13.8 11.5* 12.9 11.0* 11.7*  HCT 42.0 33.8* 37.9 31.9* 34.5*  MCV 91.1 86.7 87.7 86.4 87.8  PLT 271 261 276 272 291   Cardiac Enzymes: No results for input(s): "CKTOTAL", "CKMB", "CKMBINDEX", "TROPONINI" in the last 168 hours. BNP: Invalid input(s): "POCBNP" CBG: Recent Labs  Lab 05/02/22 1053 05/03/22 0743 05/03/22 1114 05/04/22 1004  GLUCAP 121* 122* 112* 135*   D-Dimer No results for input(s): "DDIMER" in the last 72 hours. Hgb A1c No results  for input(s): "HGBA1C" in the last 72 hours. Lipid Profile Recent Labs    05/02/22 0713  TRIG 98   Thyroid function studies No results for input(s): "TSH", "T4TOTAL", "T3FREE", "THYROIDAB" in the last 72 hours.  Invalid input(s): "FREET3" Anemia work up No results for input(s): "VITAMINB12", "FOLATE", "FERRITIN", "TIBC", "IRON", "RETICCTPCT" in the last  72 hours. Urinalysis    Component Value Date/Time   COLORURINE YELLOW 05/02/2022 0900   APPEARANCEUR CLEAR 05/02/2022 0900   LABSPEC 1.010 05/02/2022 0900   PHURINE 6.0 05/02/2022 0900   GLUCOSEU NEGATIVE 05/02/2022 0900   HGBUR NEGATIVE 05/02/2022 0900   BILIRUBINUR NEGATIVE 05/02/2022 0900   BILIRUBINUR neg 04/11/2015 1014   KETONESUR NEGATIVE 05/02/2022 0900   PROTEINUR NEGATIVE 05/02/2022 0900   UROBILINOGEN 0.2 04/11/2015 1014   NITRITE NEGATIVE 05/02/2022 0900   LEUKOCYTESUR NEGATIVE 05/02/2022 0900   Sepsis Labs Recent Labs  Lab 04/30/22 0558 05/02/22 0713 05/03/22 0634 05/04/22 0459  WBC 13.1* 11.4* 8.7 8.7   Microbiology Recent Results (from the past 240 hour(s))  SARS Coronavirus 2 by RT PCR (hospital order, performed in [facility] hospital lab) *cepheid single result test* Anterior Nasal Swab     Status: None   Collection Time: 04/29/22  7:44 AM   Specimen: Anterior Nasal Swab  Result Value Ref Range Status   SARS Coronavirus 2 by RT PCR NEGATIVE NEGATIVE Final    Comment: (NOTE) SARS-CoV-2 target nucleic acids are NOT DETECTED.  The SARS-CoV-2 RNA is generally detectable in upper and lower respiratory specimens during the acute phase of infection. The lowest concentration of SARS-CoV-2 viral copies this assay can detect is 250 copies / mL. A negative result does not preclude SARS-CoV-2 infection and should not be used as the sole basis for treatment or other patient management decisions.  A negative result may occur with improper specimen collection / handling, submission of specimen other than  nasopharyngeal swab, presence of viral mutation(s) within the areas targeted by this assay, and inadequate number of viral copies (<250 copies / mL). A negative result must be combined with clinical observations, patient history, and epidemiological information.  Fact Sheet for Patients:   https://www.patel.info/  Fact Sheet for Healthcare Providers: https://hall.com/  This test is not yet approved or  cleared by the [place] FDA and has been authorized for detection and/or diagnosis of SARS-CoV-2 by FDA under an Emergency Use Authorization (EUA).  This EUA will remain in effect (meaning this test can be used) for the duration of the COVID-19 declaration under Section 564(b)(1) of the Act, 21 U.S.C. section 360bbb-3(b)(1), unless the authorization is terminated or revoked sooner.  Performed at [facility], [address] [place], [place] [address]   SARS Coronavirus 2 by RT PCR (hospital order, performed in [facility] hospital lab) *cepheid single result test* Anterior Nasal Swab     Status: None   Collection Time: 05/02/22  7:14 AM   Specimen: Anterior Nasal Swab  Result Value Ref Range Status   SARS Coronavirus 2 by RT PCR NEGATIVE NEGATIVE Final    Comment: (NOTE) SARS-CoV-2 target nucleic acids are NOT DETECTED.  The SARS-CoV-2 RNA is generally detectable in upper and lower respiratory specimens during the acute phase of infection. The lowest concentration of SARS-CoV-2 viral copies this assay can detect is 250 copies / mL. A negative result does not preclude SARS-CoV-2 infection and should not be used as the sole basis for treatment or other patient management decisions.  A negative result may occur with improper specimen collection / handling, submission of specimen other than nasopharyngeal swab, presence of viral mutation(s) within the areas targeted by this assay, and inadequate number of viral copies (<250  copies / mL). A negative result must be combined with clinical observations, patient history, and epidemiological information.  Fact Sheet for Patients:   https://www.patel.info/  Fact Sheet  for Healthcare Providers: https://hall.com/  This test is not yet approved or  cleared by the [place] and has been authorized for detection and/or diagnosis of SARS-CoV-2 by FDA under an Emergency Use Authorization (EUA).  This EUA will remain in effect (meaning this test can be used) for the duration of the COVID-19 declaration under Section 564(b)(1) of the Act, 21 U.S.C. section 360bbb-3(b)(1), unless the authorization is terminated or revoked sooner.  Performed at [facility], [place], [place] [address]   C Difficile Quick Screen w PCR reflex     Status: None   Collection Time: 05/02/22  5:30 PM   Specimen: STOOL  Result Value Ref Range Status   C Diff antigen NEGATIVE NEGATIVE Final   C Diff toxin NEGATIVE NEGATIVE Final   C Diff interpretation No C. difficile detected.  Final    Comment: Performed at [facility], [address] [place], [place] [address]  Gastrointestinal Panel by PCR , Stool     Status: None   Collection Time: 05/02/22  5:30 PM   Specimen: Stool  Result Value Ref Range Status   Campylobacter species NOT DETECTED NOT DETECTED Final   Plesimonas shigelloides NOT DETECTED NOT DETECTED Final   Salmonella species NOT DETECTED NOT DETECTED Final   Yersinia enterocolitica NOT DETECTED NOT DETECTED Final   Vibrio species NOT DETECTED NOT DETECTED Final   Vibrio cholerae NOT DETECTED NOT DETECTED Final   Enteroaggregative E coli (EAEC) NOT DETECTED NOT DETECTED Final   Enteropathogenic E coli (EPEC) NOT DETECTED NOT DETECTED Final   Enterotoxigenic E coli (ETEC) NOT DETECTED NOT DETECTED Final   Shiga like toxin producing E coli (STEC) NOT DETECTED NOT DETECTED Final    Shigella/Enteroinvasive E coli (EIEC) NOT DETECTED NOT DETECTED Final   Cryptosporidium NOT DETECTED NOT DETECTED Final   Cyclospora cayetanensis NOT DETECTED NOT DETECTED Final   Entamoeba histolytica NOT DETECTED NOT DETECTED Final   Giardia lamblia NOT DETECTED NOT DETECTED Final   Adenovirus F40/41 NOT DETECTED NOT DETECTED Final   Astrovirus NOT DETECTED NOT DETECTED Final   Norovirus GI/GII NOT DETECTED NOT DETECTED Final   Rotavirus A NOT DETECTED NOT DETECTED Final   Sapovirus (I, II, IV, and V) NOT DETECTED NOT DETECTED Final    Comment: Performed at [facility], [address] [place], [place] [address]     Time coordinating discharge: Over 30 minutes  SIGNED:   Wyvonnia Dusky, MD  [facility] 05/04/2022, 11:31 AM Pager   If 7PM-7AM, please contact night-coverage

## 2022-05-04 NOTE — TOC Initial Note (Signed)
Transition of Care [facility]) - Initial/Assessment Note    Patient Details  Name: Ana Randall MRN: [id number] Date of Birth: [birth year]  Transition of Care [facility]) CM/SW Contact:    Beverly Sessions, RN Phone Number: 05/04/2022, 12:12 PM  Clinical Narrative:                  Transition of Care [facility]) Screening Note   Patient Details  Name: Ana Randall Date of Birth: 12/07/1933   Transition of Care [facility]) CM/SW Contact:    Beverly Sessions, RN Phone Number: 05/04/2022, 12:12 PM    Transition of Care Department [facility]) has reviewed patient and no TOC needs have been identified at this time. We will continue to monitor patient advancement through interdisciplinary progression rounds. If new patient transition needs arise, please place a TOC consult.          Patient Goals and CMS Choice        Expected Discharge Plan and Services           Expected Discharge Date: 05/04/22                                    Prior Living Arrangements/Services                       Activities of Daily Living Home Assistive Devices/Equipment: None ADL Screening (condition at time of admission) Patient's cognitive ability adequate to safely complete daily activities?: Yes Is the patient deaf or have difficulty hearing?: No Does the patient have difficulty seeing, even when wearing glasses/contacts?: No Does the patient have difficulty concentrating, remembering, or making decisions?: No Patient able to express need for assistance with ADLs?: Yes Does the patient have difficulty dressing or bathing?: No Independently performs ADLs?: Yes (appropriate for developmental age) Does the patient have difficulty walking or climbing stairs?: No Weakness of Legs: None Weakness of Arms/Hands: None  Permission Sought/Granted                  Emotional Assessment              Admission diagnosis:  Diarrhea [R19.7] Dehydration [E86.0] Acute pancreatitis,  unspecified complication status, unspecified pancreatitis type [K85.90] Patient Active Problem List   Diagnosis Date Noted   Diarrhea 05/02/2022   HTN (hypertension) 05/02/2022   Hx of cholecystectomy 05/02/2022   Chronic kidney disease, stage 3a ([place]) 05/02/2022   Elevated lipase 05/02/2022   Dehydration    Calculus of gallbladder with acute cholecystitis without obstruction 04/29/2022   Adult hypothyroidism 03/13/2015   Alopecia 03/13/2015   Anxiety 03/13/2015   Cannot sleep 03/13/2015   Hemorrhoid 03/13/2015   History of basal cell cancer 03/13/2015   H/O cataract 03/13/2015   BP (high blood pressure) 03/13/2015   Elevated cholesterol with elevated triglycerides 03/13/2015   Heart & renal disease, hypertensive, with heart failure ([place]) 03/13/2015   Osteoporosis, post-menopausal 03/13/2015   Post menopausal syndrome 03/13/2015   Avitaminosis D 03/13/2015   Pruritic rash 03/13/2015   H/O neoplasm 11/25/2014   PCP:  Idelle Crouch, MD Pharmacy:   [facility] [address], [place] - [facility] [address] [place] [address] Phone: [phone] Fax: [address], [place] [address] Phone: [phone] Fax: [phone]  [id number] [place], [place] [address] [place] [id number] Phone: [phone] Fax: [phone]  [facility] ([place], [place] [facility] [address] [place] [address] [place] [address] Phone: [phone] Fax: [phone]     Social Determinants of Health (SDOH) Interventions    Readmission Risk Interventions     No data to display

## 2022-05-20 ENCOUNTER — Encounter: Payer: Self-pay | Admitting: Physician Assistant

## 2022-05-20 ENCOUNTER — Other Ambulatory Visit: Payer: Self-pay

## 2022-05-20 ENCOUNTER — Ambulatory Visit (INDEPENDENT_AMBULATORY_CARE_PROVIDER_SITE_OTHER): Payer: Medicare Other | Admitting: Physician Assistant

## 2022-05-20 VITALS — BP 142/80 | HR 85 | Temp 98.4°F | Ht 59.0 in | Wt 128.6 lb

## 2022-05-20 DIAGNOSIS — K8000 Calculus of gallbladder with acute cholecystitis without obstruction: Secondary | ICD-10-CM

## 2022-05-20 DIAGNOSIS — Z09 Encounter for follow-up examination after completed treatment for conditions other than malignant neoplasm: Secondary | ICD-10-CM

## 2022-05-20 NOTE — Progress Notes (Signed)
[facility] POST-OP OFFICE VISIT  05/20/2022  HPI: Ana Randall is a 88 y.o. female 21 days s/p robotic assisted laparoscopic cholecystectomy for acute cholecystitis with Dr Hampton Abbot  She did have readmission from 09/24 - 09/26 secondary to diarrhea thought to be from Abx use. She ultimately did well  Today, she reports that she is doing much better.  No abdominal pain, nausea, emesis, or bowel changes Incisions are healing well Ambulating well Tolerating PO  Vital signs: BP (!) 142/80   Pulse 85   Temp 98.4 F (36.9 C) (Oral)   Ht '4\' 11"'$  (1.499 m)   Wt 128 lb 9.6 oz (58.3 kg)   SpO2 95%   BMI 25.97 kg/m    Physical Exam: Constitutional: Well appearing female, NAD Abdomen: Soft, non-tender, non-distended, no rebound/guarding Skin: Laparoscopic incisions are healing well, no erythema or drainage   Assessment/Plan: This is a 88 y.o. female 21 days s/p robotic assisted laparoscopic cholecystectomy for acute cholecystitis    - Pain control prn  - Reviewed wound care recommendation  - Reviewed lifting restrictions; 4 weeks total  - Reviewed surgical pathology; Acute on chronic cholecystitis  - She can follow up on as needed basis; She understands to call with questions/concerns  -- Edison Simon, PA-C [facility] 05/20/2022, 1:41 PM M-F: 7am - 4pm

## 2022-05-20 NOTE — Patient Instructions (Signed)

## 2022-05-25 ENCOUNTER — Other Ambulatory Visit: Payer: Self-pay | Admitting: Internal Medicine

## 2022-05-25 DIAGNOSIS — Z1231 Encounter for screening mammogram for malignant neoplasm of breast: Secondary | ICD-10-CM

## 2022-06-15 ENCOUNTER — Ambulatory Visit: Payer: Medicare Other | Admitting: Dermatology

## 2022-06-22 ENCOUNTER — Ambulatory Visit: Payer: Medicare Other | Admitting: Dermatology

## 2022-06-25 ENCOUNTER — Ambulatory Visit
Admission: RE | Admit: 2022-06-25 | Discharge: 2022-06-25 | Disposition: A | Discharge status: Home / Self Care | Payer: Medicare Other | Source: Ambulatory Visit | Attending: Internal Medicine | Admitting: Internal Medicine

## 2022-06-25 DIAGNOSIS — Z1231 Encounter for screening mammogram for malignant neoplasm of breast: Secondary | ICD-10-CM | POA: Diagnosis present

## 2023-10-26 DIAGNOSIS — R7309 Other abnormal glucose: Secondary | ICD-10-CM | POA: Diagnosis not present

## 2023-10-26 DIAGNOSIS — E039 Hypothyroidism, unspecified: Secondary | ICD-10-CM | POA: Diagnosis not present

## 2023-10-26 DIAGNOSIS — I10 Essential (primary) hypertension: Secondary | ICD-10-CM | POA: Diagnosis not present

## 2023-10-26 DIAGNOSIS — Z79899 Other long term (current) drug therapy: Secondary | ICD-10-CM | POA: Diagnosis not present

## 2023-11-02 DIAGNOSIS — E039 Hypothyroidism, unspecified: Secondary | ICD-10-CM | POA: Diagnosis not present

## 2023-11-02 DIAGNOSIS — I10 Essential (primary) hypertension: Secondary | ICD-10-CM | POA: Diagnosis not present

## 2023-11-02 DIAGNOSIS — E782 Mixed hyperlipidemia: Secondary | ICD-10-CM | POA: Diagnosis not present

## 2023-11-02 DIAGNOSIS — R7309 Other abnormal glucose: Secondary | ICD-10-CM | POA: Diagnosis not present

## 2023-11-02 DIAGNOSIS — Z0000 Encounter for general adult medical examination without abnormal findings: Secondary | ICD-10-CM | POA: Diagnosis not present

## 2023-11-02 DIAGNOSIS — N183 Chronic kidney disease, stage 3 unspecified: Secondary | ICD-10-CM | POA: Diagnosis not present

## 2023-11-02 DIAGNOSIS — F419 Anxiety disorder, unspecified: Secondary | ICD-10-CM | POA: Diagnosis not present

## 2023-11-02 DIAGNOSIS — Z79899 Other long term (current) drug therapy: Secondary | ICD-10-CM | POA: Diagnosis not present

## 2023-11-22 DIAGNOSIS — D485 Neoplasm of uncertain behavior of skin: Secondary | ICD-10-CM | POA: Diagnosis not present

## 2023-11-22 DIAGNOSIS — L570 Actinic keratosis: Secondary | ICD-10-CM | POA: Diagnosis not present

## 2023-11-22 DIAGNOSIS — D2272 Melanocytic nevi of left lower limb, including hip: Secondary | ICD-10-CM | POA: Diagnosis not present

## 2023-11-22 DIAGNOSIS — L439 Lichen planus, unspecified: Secondary | ICD-10-CM | POA: Diagnosis not present

## 2023-11-22 DIAGNOSIS — D2262 Melanocytic nevi of left upper limb, including shoulder: Secondary | ICD-10-CM | POA: Diagnosis not present

## 2023-11-22 DIAGNOSIS — L821 Other seborrheic keratosis: Secondary | ICD-10-CM | POA: Diagnosis not present

## 2023-11-22 DIAGNOSIS — D2271 Melanocytic nevi of right lower limb, including hip: Secondary | ICD-10-CM | POA: Diagnosis not present

## 2023-11-22 DIAGNOSIS — D225 Melanocytic nevi of trunk: Secondary | ICD-10-CM | POA: Diagnosis not present

## 2023-11-22 DIAGNOSIS — D2261 Melanocytic nevi of right upper limb, including shoulder: Secondary | ICD-10-CM | POA: Diagnosis not present

## 2024-01-24 DIAGNOSIS — L570 Actinic keratosis: Secondary | ICD-10-CM | POA: Diagnosis not present

## 2024-02-28 DIAGNOSIS — Z961 Presence of intraocular lens: Secondary | ICD-10-CM | POA: Diagnosis not present

## 2024-05-16 DIAGNOSIS — Z79899 Other long term (current) drug therapy: Secondary | ICD-10-CM | POA: Diagnosis not present

## 2024-05-16 DIAGNOSIS — I10 Essential (primary) hypertension: Secondary | ICD-10-CM | POA: Diagnosis not present

## 2024-05-16 DIAGNOSIS — E782 Mixed hyperlipidemia: Secondary | ICD-10-CM | POA: Diagnosis not present

## 2024-05-16 DIAGNOSIS — R7309 Other abnormal glucose: Secondary | ICD-10-CM | POA: Diagnosis not present

## 2024-05-16 DIAGNOSIS — E039 Hypothyroidism, unspecified: Secondary | ICD-10-CM | POA: Diagnosis not present

## 2024-05-18 DIAGNOSIS — Z23 Encounter for immunization: Secondary | ICD-10-CM | POA: Diagnosis not present

## 2024-05-18 DIAGNOSIS — I10 Essential (primary) hypertension: Secondary | ICD-10-CM | POA: Diagnosis not present

## 2024-05-18 DIAGNOSIS — E782 Mixed hyperlipidemia: Secondary | ICD-10-CM | POA: Diagnosis not present

## 2024-05-18 DIAGNOSIS — E039 Hypothyroidism, unspecified: Secondary | ICD-10-CM | POA: Diagnosis not present

## 2024-05-18 DIAGNOSIS — F419 Anxiety disorder, unspecified: Secondary | ICD-10-CM | POA: Diagnosis not present

## 2024-05-18 DIAGNOSIS — R7309 Other abnormal glucose: Secondary | ICD-10-CM | POA: Diagnosis not present

## 2024-05-18 DIAGNOSIS — N183 Chronic kidney disease, stage 3 unspecified: Secondary | ICD-10-CM | POA: Diagnosis not present
